# Patient Record
Sex: Female | Born: 1960 | Race: White | Hispanic: No | Smoking: Current every day smoker
Health system: Southern US, Community
[De-identification: ages and names within clinical notes are randomized; demographics above are authoritative.]

## PROBLEM LIST (undated history)

## (undated) DIAGNOSIS — M1990 Unspecified osteoarthritis, unspecified site: Secondary | ICD-10-CM

## (undated) DIAGNOSIS — T148XXA Other injury of unspecified body region, initial encounter: Secondary | ICD-10-CM

## (undated) HISTORY — PX: HAND SURGERY: SHX662

---

## 2003-06-24 ENCOUNTER — Emergency Department (HOSPITAL_COMMUNITY): Admission: EM | Admit: 2003-06-24 | Discharge: 2003-06-25 | Payer: Self-pay | Admitting: Emergency Medicine

## 2003-10-19 ENCOUNTER — Emergency Department (HOSPITAL_COMMUNITY): Admission: EM | Admit: 2003-10-19 | Discharge: 2003-10-19 | Payer: Self-pay | Admitting: Emergency Medicine

## 2008-02-20 ENCOUNTER — Emergency Department (HOSPITAL_COMMUNITY): Admission: EM | Admit: 2008-02-20 | Discharge: 2008-02-20 | Payer: Self-pay | Admitting: Emergency Medicine

## 2008-04-02 ENCOUNTER — Emergency Department (HOSPITAL_COMMUNITY): Admission: EM | Admit: 2008-04-02 | Discharge: 2008-04-02 | Payer: Self-pay | Admitting: Emergency Medicine

## 2008-08-10 ENCOUNTER — Emergency Department (HOSPITAL_COMMUNITY): Admission: EM | Admit: 2008-08-10 | Discharge: 2008-08-10 | Payer: Self-pay | Admitting: Emergency Medicine

## 2008-10-26 ENCOUNTER — Emergency Department (HOSPITAL_COMMUNITY): Admission: EM | Admit: 2008-10-26 | Discharge: 2008-10-26 | Payer: Self-pay | Admitting: Emergency Medicine

## 2008-10-30 DIAGNOSIS — Z8709 Personal history of other diseases of the respiratory system: Secondary | ICD-10-CM | POA: Insufficient documentation

## 2008-10-30 DIAGNOSIS — L03319 Cellulitis of trunk, unspecified: Secondary | ICD-10-CM

## 2008-10-30 DIAGNOSIS — L02219 Cutaneous abscess of trunk, unspecified: Secondary | ICD-10-CM

## 2008-10-30 DIAGNOSIS — F329 Major depressive disorder, single episode, unspecified: Secondary | ICD-10-CM

## 2008-10-30 DIAGNOSIS — F3289 Other specified depressive episodes: Secondary | ICD-10-CM | POA: Insufficient documentation

## 2008-12-16 ENCOUNTER — Emergency Department (HOSPITAL_COMMUNITY): Admission: EM | Admit: 2008-12-16 | Discharge: 2008-12-16 | Payer: Self-pay | Admitting: Emergency Medicine

## 2009-07-29 ENCOUNTER — Ambulatory Visit: Payer: Self-pay | Admitting: Gastroenterology

## 2010-02-08 ENCOUNTER — Emergency Department (HOSPITAL_COMMUNITY): Admission: EM | Admit: 2010-02-08 | Discharge: 2010-02-09 | Payer: Self-pay | Admitting: Emergency Medicine

## 2010-07-06 ENCOUNTER — Telehealth: Payer: Self-pay | Admitting: Infectious Disease

## 2010-07-06 DIAGNOSIS — M86679 Other chronic osteomyelitis, unspecified ankle and foot: Secondary | ICD-10-CM | POA: Insufficient documentation

## 2010-09-20 NOTE — Progress Notes (Addendum)
Summary: Care Plan OVersight  Phone Note Outgoing Call   Call placed by: Acey Lav MD,  July 06, 2010 11:51 AM Details for Reason: Care Plan Oversight Summary of Call: [phone] (30 or more mins)  I have supervised home care and/or infusion therapy for this pt, including providing orders for care, review of labs and/or home health care plans, communicating with the home health care professionals and/or patient/caregivers to integrate current information into the medical treatment plan and/or adjust the medical therapy. This supervision has been provided for _32__minutes during the calendar month. 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

## 2010-11-06 LAB — DIFFERENTIAL
Eosinophils Absolute: 0.1 10*3/uL (ref 0.0–0.7)
Eosinophils Relative: 1 % (ref 0–5)
Lymphs Abs: 2.1 10*3/uL (ref 0.7–4.0)

## 2010-11-06 LAB — PROTIME-INR: Prothrombin Time: 12.4 seconds (ref 11.6–15.2)

## 2010-11-06 LAB — ACETAMINOPHEN LEVEL: Acetaminophen (Tylenol), Serum: 16.2 ug/mL (ref 10–30)

## 2010-11-06 LAB — URINALYSIS, ROUTINE W REFLEX MICROSCOPIC
Ketones, ur: NEGATIVE mg/dL
Nitrite: NEGATIVE
Protein, ur: NEGATIVE mg/dL
Urobilinogen, UA: 0.2 mg/dL (ref 0.0–1.0)

## 2010-11-06 LAB — COMPREHENSIVE METABOLIC PANEL
ALT: 17 U/L (ref 0–35)
AST: 21 U/L (ref 0–37)
Alkaline Phosphatase: 54 U/L (ref 39–117)
CO2: 25 mEq/L (ref 19–32)
Calcium: 10.4 mg/dL (ref 8.4–10.5)
Chloride: 108 mEq/L (ref 96–112)
GFR calc Af Amer: >60 mL/min (ref >60)
GFR calc non Af Amer: >60 mL/min (ref >60)
Potassium: 3.3 mEq/L — ABNORMAL LOW (ref 3.5–5.1)
Sodium: 140 mEq/L (ref 135–145)

## 2010-11-06 LAB — POCT PREGNANCY, URINE: Preg Test, Ur: NEGATIVE

## 2010-11-06 LAB — ETHANOL: Alcohol, Ethyl (B): <5 mg/dL (ref 0–10)

## 2010-11-06 LAB — CBC
MCHC: 34.4 g/dL (ref 30.0–36.0)
RBC: 4.37 MIL/uL (ref 3.87–5.11)
WBC: 9.5 10*3/uL (ref 4.0–10.5)

## 2010-11-06 LAB — RAPID URINE DRUG SCREEN, HOSP PERFORMED: Barbiturates: NOT DETECTED

## 2010-11-06 LAB — AMMONIA: Ammonia: 49 umol/L — ABNORMAL HIGH (ref 11–35)

## 2010-12-01 LAB — CULTURE, ROUTINE-ABSCESS

## 2011-05-19 LAB — CULTURE, ROUTINE-ABSCESS: Gram Stain: NONE SEEN

## 2013-01-30 ENCOUNTER — Encounter (HOSPITAL_COMMUNITY): Payer: Self-pay | Admitting: Adult Health

## 2013-01-30 ENCOUNTER — Ambulatory Visit (HOSPITAL_COMMUNITY)
Admission: EM | Admit: 2013-01-30 | Discharge: 2013-02-01 | Disposition: A | Discharge status: Home / Self Care | Payer: MEDICAID | Attending: Orthopedic Surgery | Admitting: Orthopedic Surgery

## 2013-01-30 DIAGNOSIS — S61409A Unspecified open wound of unspecified hand, initial encounter: Secondary | ICD-10-CM | POA: Insufficient documentation

## 2013-01-30 DIAGNOSIS — W5501XA Bitten by cat, initial encounter: Secondary | ICD-10-CM

## 2013-01-30 DIAGNOSIS — T148XXA Other injury of unspecified body region, initial encounter: Secondary | ICD-10-CM

## 2013-01-30 DIAGNOSIS — IMO0001 Reserved for inherently not codable concepts without codable children: Secondary | ICD-10-CM | POA: Insufficient documentation

## 2013-01-30 DIAGNOSIS — M659 Synovitis and tenosynovitis, unspecified: Secondary | ICD-10-CM

## 2013-01-30 HISTORY — DX: Other injury of unspecified body region, initial encounter: T14.8XXA

## 2013-01-30 LAB — BASIC METABOLIC PANEL
BUN: 9 mg/dL (ref 6–23)
Chloride: 104 mEq/L (ref 96–112)
GFR calc Af Amer: >90 mL/min (ref >90)
Glucose, Bld: 100 mg/dL — ABNORMAL HIGH (ref 70–99)
Potassium: 3.4 mEq/L — ABNORMAL LOW (ref 3.5–5.1)

## 2013-01-30 LAB — CBC WITH DIFFERENTIAL/PLATELET
Hemoglobin: 11.8 g/dL — ABNORMAL LOW (ref 12.0–15.0)
Lymphs Abs: 1.5 10*3/uL (ref 0.7–4.0)
Monocytes Relative: 13 % — ABNORMAL HIGH (ref 3–12)
Neutro Abs: 7.5 10*3/uL (ref 1.7–7.7)
Neutrophils Relative %: 71 % (ref 43–77)
RBC: 3.95 MIL/uL (ref 3.87–5.11)

## 2013-01-30 MED ORDER — OXYCODONE-ACETAMINOPHEN 5-325 MG PO TABS
1.0000 | ORAL_TABLET | Freq: Once | ORAL | Status: AC
  Administered 2013-01-30: 1 via ORAL
  Filled 2013-01-30: qty 1

## 2013-01-30 MED ORDER — TETANUS-DIPHTH-ACELL PERTUSSIS 5-2.5-18.5 LF-MCG/0.5 IM SUSP
0.5000 mL | Freq: Once | INTRAMUSCULAR | Status: AC
  Administered 2013-01-31: 0.5 mL via INTRAMUSCULAR
  Filled 2013-01-30: qty 0.5

## 2013-01-30 MED ORDER — ONDANSETRON 4 MG PO TBDP
8.0000 mg | ORAL_TABLET | Freq: Once | ORAL | Status: AC
  Administered 2013-01-30: 8 mg via ORAL
  Filled 2013-01-30: qty 2

## 2013-01-30 MED ORDER — MORPHINE SULFATE 4 MG/ML IJ SOLN
4.0000 mg | INTRAMUSCULAR | Status: DC | PRN
  Administered 2013-01-31 (×2): 4 mg via INTRAVENOUS
  Filled 2013-01-30 (×2): qty 1

## 2013-01-30 MED ORDER — ONDANSETRON HCL 4 MG/2ML IJ SOLN: 4.0000 mg | Freq: Four times a day (QID) | INTRAMUSCULAR | Status: DC | PRN

## 2013-01-30 MED ORDER — IBUPROFEN 200 MG PO TABS
400.0000 mg | ORAL_TABLET | Freq: Once | ORAL | Status: AC
  Administered 2013-01-30: 400 mg via ORAL
  Filled 2013-01-30: qty 2

## 2013-01-30 MED ORDER — SODIUM CHLORIDE 0.9 % IV SOLN
INTRAVENOUS | Status: DC
  Administered 2013-01-31: 01:00:00 via INTRAVENOUS

## 2013-01-30 MED ORDER — SODIUM CHLORIDE 0.9 % IV SOLN
3.0000 g | Freq: Once | INTRAVENOUS | Status: AC
  Administered 2013-01-31: 3 g via INTRAVENOUS
  Filled 2013-01-30: qty 3

## 2013-01-30 NOTE — ED Notes (Signed)
Presents with left hand edema, redness, warmth and swelling that began after being bitten by her cat Monday. Pain rated 10/10. Cat up to date on rabies.

## 2013-01-30 NOTE — ED Provider Notes (Signed)
History    CSN: [id number] Arrival date & time 01/30/13  2002 First MD Initiated Contact with Patient 01/30/13 2316      Chief Complaint  Patient presents with  . Cellulitis    Patient is a 52 y.o. female presenting with animal bite. The history is provided by the patient.  Animal Bite Contact animal:  Cat Location:  Hand Hand injury location:  R hand Pain details:    Quality:  Aching   Severity:  Severe   Timing:  Constant   Progression:  Worsening Tetanus status:  Unknown Relieved by:  Nothing Worsened by:  Activity Associated symptoms: swelling    Pt was bitten by her cat on Monday.  The cat is up to date on vaccinations.  Since Monday she has had increasing pain and swelling of her left hand.  The swelling has not extended beyond the wrist. .  No fevers although her hand feels very hot.  No vomiting or diarrhea.   History reviewed. No pertinent past medical history.  History reviewed. No pertinent past surgical history.  History reviewed. No pertinent family history.  History  Substance Use Topics  . Smoking status: Current Every Day Smoker    Types: Cigarettes  . Smokeless tobacco: Not on file  . Alcohol Use: No    OB History   Grav Para Term Preterm Abortions TAB SAB Ect Mult Living                  Review of Systems  All other systems reviewed and are negative.    Allergies  Review of patient's allergies indicates no known allergies.  Home Medications   Current Outpatient Rx  Name  Route  Sig  Dispense  Refill  . Acetaminophen (TYLENOL PO)   Oral   Take 4 tablets by mouth once.         . Ibuprofen (IBU PO)   Oral   Take 8 tablets by mouth once.           BP 109/94  Pulse 80  Temp(Src) 98.1 F (36.7 C) (Oral)  Resp 16  SpO2 99%  Physical Exam  Nursing note and vitals reviewed. Constitutional: She appears well-developed and well-nourished. No distress.  HENT:  Head: Normocephalic and atraumatic.  Right Ear: External ear normal.   Left Ear: External ear normal.  Eyes: Conjunctivae are normal. Right eye exhibits no discharge. Left eye exhibits no discharge. No scleral icterus.  Neck: Neck supple. No tracheal deviation present.  Cardiovascular: Normal rate, regular rhythm and intact distal pulses.   Pulmonary/Chest: Effort normal and breath sounds normal. No stridor. No respiratory distress. She has no wheezes. She has no rales.  Abdominal: Soft. Bowel sounds are normal. She exhibits no distension. There is no tenderness. There is no rebound and no guarding.  Musculoskeletal: She exhibits no edema and no tenderness.       Right hand: She exhibits tenderness and swelling. She exhibits no laceration. Normal sensation noted. Normal strength noted.  Small puncture wounds noted at the right first MCP joint, erythema and edema of the right dorsal aspect of the hand primarily located in the MCP joint that extends to the first digit as well as into the second digit and to the dorsum of the hand, pain with passive and active range of motion of the first digit  Neurological: She is alert. She has normal strength. No sensory deficit. Cranial nerve deficit:  no gross defecits noted. She exhibits normal muscle tone. She  displays no seizure activity. Coordination normal.  Skin: Skin is warm and dry. No rash noted.  Psychiatric: She has a normal mood and affect.    ED Course  Procedures (including critical care time)  Labs Reviewed  CBC WITH DIFFERENTIAL - Abnormal; Notable for the following:    Hemoglobin 11.8 (*)    HCT 35.1 (*)    Monocytes Relative 13 (*)    Monocytes Absolute 1.4 (*)    All other components within normal limits  BASIC METABOLIC PANEL - Abnormal; Notable for the following:    Potassium 3.4 (*)    Glucose, Bld 100 (*)    All other components within normal limits  CBC WITH DIFFERENTIAL  BASIC METABOLIC PANEL   No results found.   1. Tenosynovitis   2. Cat bite of hand, right, initial encounter       MDM  Patient's symptoms are concerning for a tenosynovitis as well as possible abscess formation associated with her cat bite cellulitis. I will start patient on IV antibiotics. I will consult with the orthopedic hand surgeon regarding further treatment.  12:07 AM Dr Merlyn Lot evaluated the patient.  Family had brought food in for the patient and the patient ate [employer], without staff knowing.  Operative intervention will be delayed.       Celene Kras, MD 01/31/13 [phone]

## 2013-01-31 ENCOUNTER — Encounter (HOSPITAL_COMMUNITY)
Admission: EM | Disposition: A | Discharge status: Home / Self Care | Payer: Self-pay | Source: Home / Self Care | Attending: Emergency Medicine

## 2013-01-31 ENCOUNTER — Emergency Department (HOSPITAL_COMMUNITY): Payer: Self-pay

## 2013-01-31 ENCOUNTER — Encounter (HOSPITAL_COMMUNITY): Payer: Self-pay | Admitting: Certified Registered"

## 2013-01-31 ENCOUNTER — Emergency Department (HOSPITAL_COMMUNITY): Payer: Self-pay | Admitting: Certified Registered"

## 2013-01-31 HISTORY — PX: I&D EXTREMITY: SHX5045

## 2013-01-31 HISTORY — PX: INCISION / DRAINAGE HAND / FINGER: SUR695

## 2013-01-31 LAB — CREATININE, SERUM
Creatinine, Ser: 0.43 mg/dL — ABNORMAL LOW (ref 0.50–1.10)
GFR calc Af Amer: >90 mL/min (ref >90)
GFR calc non Af Amer: >90 mL/min (ref >90)

## 2013-01-31 LAB — CBC
Hemoglobin: 10.9 g/dL — ABNORMAL LOW (ref 12.0–15.0)
MCH: 29.5 pg (ref 26.0–34.0)
RBC: 3.69 MIL/uL — ABNORMAL LOW (ref 3.87–5.11)

## 2013-01-31 SURGERY — IRRIGATION AND DEBRIDEMENT EXTREMITY
Anesthesia: General | Site: Hand | Laterality: Left | Wound class: Dirty or Infected

## 2013-01-31 MED ORDER — SODIUM CHLORIDE 0.9 % IV SOLN
1.5000 g | Freq: Once | INTRAVENOUS | Status: AC
  Administered 2013-01-31: 1.5 g via INTRAVENOUS
  Filled 2013-01-31: qty 1.5

## 2013-01-31 MED ORDER — LIDOCAINE HCL (CARDIAC) 20 MG/ML IV SOLN
INTRAVENOUS | Status: DC | PRN
  Administered 2013-01-31: 75 mg via INTRAVENOUS

## 2013-01-31 MED ORDER — METHOCARBAMOL 100 MG/ML IJ SOLN
500.0000 mg | Freq: Four times a day (QID) | INTRAVENOUS | Status: DC | PRN
  Filled 2013-01-31: qty 5

## 2013-01-31 MED ORDER — BUPIVACAINE HCL (PF) 0.25 % IJ SOLN
INTRAMUSCULAR | Status: DC | PRN
  Administered 2013-01-31: 10 mL

## 2013-01-31 MED ORDER — MIDAZOLAM HCL 5 MG/5ML IJ SOLN
INTRAMUSCULAR | Status: DC | PRN
  Administered 2013-01-31: 2 mg via INTRAVENOUS

## 2013-01-31 MED ORDER — ONDANSETRON HCL 4 MG/2ML IJ SOLN: 4.0000 mg | Freq: Four times a day (QID) | INTRAMUSCULAR | Status: DC | PRN

## 2013-01-31 MED ORDER — LACTATED RINGERS IV SOLN: INTRAVENOUS | Status: DC

## 2013-01-31 MED ORDER — OXYCODONE HCL 5 MG PO TABS: 5.0000 mg | ORAL_TABLET | Freq: Once | ORAL | Status: DC | PRN

## 2013-01-31 MED ORDER — METOCLOPRAMIDE HCL 5 MG/ML IJ SOLN: 5.0000 mg | Freq: Three times a day (TID) | INTRAMUSCULAR | Status: DC | PRN

## 2013-01-31 MED ORDER — MORPHINE SULFATE 2 MG/ML IJ SOLN
1.0000 mg | INTRAMUSCULAR | Status: DC | PRN
  Administered 2013-01-31 (×2): 1 mg via INTRAVENOUS
  Filled 2013-01-31 (×2): qty 1

## 2013-01-31 MED ORDER — SODIUM CHLORIDE 0.9 % IV SOLN
1.5000 g | Freq: Four times a day (QID) | INTRAVENOUS | Status: DC
  Administered 2013-01-31: 1.5 g via INTRAVENOUS
  Filled 2013-01-31 (×4): qty 1.5

## 2013-01-31 MED ORDER — ONDANSETRON HCL 4 MG PO TABS: 4.0000 mg | ORAL_TABLET | Freq: Four times a day (QID) | ORAL | Status: DC | PRN

## 2013-01-31 MED ORDER — OXYCODONE HCL 5 MG/5ML PO SOLN
5.0000 mg | Freq: Once | ORAL | Status: DC | PRN
Start: 2013-01-31 — End: 2013-01-31

## 2013-01-31 MED ORDER — SUFENTANIL CITRATE 50 MCG/ML IV SOLN
INTRAVENOUS | Status: DC | PRN
  Administered 2013-01-31: 10 ug via INTRAVENOUS

## 2013-01-31 MED ORDER — METOCLOPRAMIDE HCL 10 MG PO TABS: 5.0000 mg | ORAL_TABLET | Freq: Three times a day (TID) | ORAL | Status: DC | PRN

## 2013-01-31 MED ORDER — LIDOCAINE HCL 4 % MT SOLN
OROMUCOSAL | Status: DC | PRN
  Administered 2013-01-31: 4 mL via TOPICAL

## 2013-01-31 MED ORDER — METHOCARBAMOL 500 MG PO TABS
500.0000 mg | ORAL_TABLET | Freq: Four times a day (QID) | ORAL | Status: DC | PRN
  Administered 2013-01-31: 500 mg via ORAL
  Filled 2013-01-31: qty 1

## 2013-01-31 MED ORDER — SUCCINYLCHOLINE CHLORIDE 20 MG/ML IJ SOLN
INTRAMUSCULAR | Status: DC | PRN
  Administered 2013-01-31: 100 mg via INTRAVENOUS

## 2013-01-31 MED ORDER — ONDANSETRON HCL 4 MG/2ML IJ SOLN: 4.0000 mg | Freq: Once | INTRAMUSCULAR | Status: DC | PRN

## 2013-01-31 MED ORDER — DIPHENHYDRAMINE HCL 12.5 MG/5ML PO ELIX: 12.5000 mg | ORAL_SOLUTION | ORAL | Status: DC | PRN

## 2013-01-31 MED ORDER — ENOXAPARIN SODIUM 40 MG/0.4ML ~~LOC~~ SOLN
40.0000 mg | SUBCUTANEOUS | Status: DC
  Filled 2013-01-31 (×2): qty 0.4

## 2013-01-31 MED ORDER — DEXAMETHASONE SODIUM PHOSPHATE 4 MG/ML IJ SOLN
INTRAMUSCULAR | Status: DC | PRN
  Administered 2013-01-31: 4 mg via INTRAVENOUS

## 2013-01-31 MED ORDER — ONDANSETRON HCL 4 MG/2ML IJ SOLN
INTRAMUSCULAR | Status: DC | PRN
  Administered 2013-01-31: 4 mg via INTRAVENOUS

## 2013-01-31 MED ORDER — OXYCODONE-ACETAMINOPHEN 10-325 MG PO TABS: ORAL_TABLET | ORAL | Status: DC

## 2013-01-31 MED ORDER — PROPOFOL 10 MG/ML IV BOLUS
INTRAVENOUS | Status: DC | PRN
  Administered 2013-01-31: 200 mg via INTRAVENOUS

## 2013-01-31 MED ORDER — LACTATED RINGERS IV SOLN
INTRAVENOUS | Status: DC | PRN
  Administered 2013-01-31 (×2): via INTRAVENOUS

## 2013-01-31 MED ORDER — HYDROMORPHONE HCL PF 1 MG/ML IJ SOLN
INTRAMUSCULAR | Status: AC
  Filled 2013-01-31: qty 1

## 2013-01-31 MED ORDER — HYDROMORPHONE HCL PF 1 MG/ML IJ SOLN
0.2500 mg | INTRAMUSCULAR | Status: DC | PRN
  Administered 2013-01-31 (×2): 0.5 mg via INTRAVENOUS

## 2013-01-31 MED ORDER — TEMAZEPAM 15 MG PO CAPS: 15.0000 mg | ORAL_CAPSULE | Freq: Every evening | ORAL | Status: DC | PRN

## 2013-01-31 MED ORDER — SODIUM CHLORIDE 0.9 % IR SOLN
Status: DC | PRN
  Administered 2013-01-31: 1000 mL

## 2013-01-31 MED ORDER — OXYCODONE-ACETAMINOPHEN 5-325 MG PO TABS
1.0000 | ORAL_TABLET | ORAL | Status: DC | PRN
  Administered 2013-01-31 (×2): 1 via ORAL
  Administered 2013-01-31: 2 via ORAL
  Administered 2013-01-31: 1 via ORAL
  Filled 2013-01-31: qty 2
  Filled 2013-01-31 (×3): qty 1

## 2013-01-31 MED ORDER — AMOXICILLIN-POT CLAVULANATE 875-125 MG PO TABS: 1.0000 | ORAL_TABLET | Freq: Two times a day (BID) | ORAL | Status: DC

## 2013-01-31 SURGICAL SUPPLY — 57 items
BANDAGE COBAN STERILE 2 (GAUZE/BANDAGES/DRESSINGS) IMPLANT
BANDAGE CONFORM 2  STR LF (GAUZE/BANDAGES/DRESSINGS) IMPLANT
BANDAGE ELASTIC 3 VELCRO ST LF (GAUZE/BANDAGES/DRESSINGS) ×2 IMPLANT
BANDAGE ELASTIC 4 VELCRO ST LF (GAUZE/BANDAGES/DRESSINGS) IMPLANT
BANDAGE GAUZE ELAST BULKY 4 IN (GAUZE/BANDAGES/DRESSINGS) ×2 IMPLANT
BNDG COHESIVE 1X5 TAN STRL LF (GAUZE/BANDAGES/DRESSINGS) IMPLANT
BNDG ESMARK 4X9 LF (GAUZE/BANDAGES/DRESSINGS) ×2 IMPLANT
CANISTER SUCTION 1500CC (MISCELLANEOUS) ×2 IMPLANT
CLOTH BEACON ORANGE TIMEOUT ST (SAFETY) ×2 IMPLANT
CORDS BIPOLAR (ELECTRODE) ×2 IMPLANT
COVER SURGICAL LIGHT HANDLE (MISCELLANEOUS) ×2 IMPLANT
CUFF TOURNIQUET SINGLE 18IN (TOURNIQUET CUFF) ×2 IMPLANT
DECANTER SPIKE VIAL GLASS SM (MISCELLANEOUS) IMPLANT
DRAIN PENROSE 1/4X12 LTX STRL (WOUND CARE) IMPLANT
DRAPE SURG 17X23 STRL (DRAPES) ×2 IMPLANT
DRSG ADAPTIC 3X8 NADH LF (GAUZE/BANDAGES/DRESSINGS) IMPLANT
DRSG EMULSION OIL 3X3 NADH (GAUZE/BANDAGES/DRESSINGS) IMPLANT
DRSG PAD ABDOMINAL 8X10 ST (GAUZE/BANDAGES/DRESSINGS) IMPLANT
GAUZE PACKING IODOFORM 1/4X5 (PACKING) ×2 IMPLANT
GAUZE XEROFORM 1X8 LF (GAUZE/BANDAGES/DRESSINGS) ×2 IMPLANT
GLOVE BIO SURGEON STRL SZ7.5 (GLOVE) ×4 IMPLANT
GLOVE BIOGEL PI IND STRL 7.5 (GLOVE) ×1 IMPLANT
GLOVE BIOGEL PI IND STRL 8 (GLOVE) ×2 IMPLANT
GLOVE BIOGEL PI INDICATOR 7.5 (GLOVE) ×1
GLOVE BIOGEL PI INDICATOR 8 (GLOVE) ×2
GLOVE SURG SS PI 7.5 STRL IVOR (GLOVE) ×2 IMPLANT
GOWN BRE IMP PREV XXLGXLNG (GOWN DISPOSABLE) ×4 IMPLANT
HANDPIECE INTERPULSE COAX TIP (DISPOSABLE)
KIT BASIN OR (CUSTOM PROCEDURE TRAY) ×2 IMPLANT
KIT ROOM TURNOVER OR (KITS) ×2 IMPLANT
LOOP VESSEL MAXI BLUE (MISCELLANEOUS) ×4 IMPLANT
LOOP VESSEL MINI RED (MISCELLANEOUS) IMPLANT
MANIFOLD NEPTUNE II (INSTRUMENTS) IMPLANT
NEEDLE HYPO 25X1 1.5 SAFETY (NEEDLE) ×2 IMPLANT
NS IRRIG 1000ML POUR BTL (IV SOLUTION) ×2 IMPLANT
PACK ORTHO EXTREMITY (CUSTOM PROCEDURE TRAY) ×2 IMPLANT
PAD ARMBOARD 7.5X6 YLW CONV (MISCELLANEOUS) ×2 IMPLANT
SCRUB BETADINE 4OZ XXX (MISCELLANEOUS) IMPLANT
SET HNDPC FAN SPRY TIP SCT (DISPOSABLE) IMPLANT
SOLUTION BETADINE 4OZ (MISCELLANEOUS) ×2 IMPLANT
SPONGE GAUZE 4X4 12PLY (GAUZE/BANDAGES/DRESSINGS) ×2 IMPLANT
SPONGE LAP 18X18 X RAY DECT (DISPOSABLE) IMPLANT
SPONGE LAP 4X18 X RAY DECT (DISPOSABLE) IMPLANT
SUCTION FRAZIER TIP 10 FR DISP (SUCTIONS) ×2 IMPLANT
SUT ETHILON 4 0 PS 2 18 (SUTURE) IMPLANT
SUT MON AB 5-0 P3 18 (SUTURE) IMPLANT
SWAB COLLECTION DEVICE MRSA (MISCELLANEOUS) ×2 IMPLANT
SYR 20ML ECCENTRIC (SYRINGE) ×2 IMPLANT
SYR CONTROL 10ML LL (SYRINGE) ×2 IMPLANT
TOWEL OR 17X24 6PK STRL BLUE (TOWEL DISPOSABLE) IMPLANT
TOWEL OR 17X26 10 PK STRL BLUE (TOWEL DISPOSABLE) ×2 IMPLANT
TUBE ANAEROBIC SPECIMEN COL (MISCELLANEOUS) ×2 IMPLANT
TUBE CONNECTING 12X1/4 (SUCTIONS) ×2 IMPLANT
TUBE FEEDING 5FR 15 INCH (TUBING) ×2 IMPLANT
UNDERPAD 30X30 INCONTINENT (UNDERPADS AND DIAPERS) ×2 IMPLANT
WATER STERILE IRR 1000ML POUR (IV SOLUTION) IMPLANT
YANKAUER SUCT BULB TIP NO VENT (SUCTIONS) IMPLANT

## 2013-01-31 NOTE — H&P (Signed)
Jessica Vance is an 52 y.o. female.   Chief Complaint: cat bite HPI: 52 yo female states she was bitten by her cat four days ago.  Has had increasing swelling and pain in left index finger since.  No fevers, chills, night sweats.  No previous injury to left hand and no other injury at this time.  History reviewed. No pertinent past medical history.  History reviewed. No pertinent past surgical history.  History reviewed. No pertinent family history. Social History:  reports that she has been smoking Cigarettes.  She has been smoking about 0.00 packs per day. She does not have any smokeless tobacco history on file. She reports that she does not drink alcohol or use illicit drugs.  Allergies: No Known Allergies   (Not in a hospital admission)  Results for orders placed during the hospital encounter of 01/30/13 (from the past 48 hour(s))  CBC WITH DIFFERENTIAL     Status: Abnormal   Collection Time    01/30/13  8:24 PM      Result Value Range   WBC 10.5  4.0 - 10.5 K/uL   RBC 3.95  3.87 - 5.11 MIL/uL   Hemoglobin 11.8 (*) 12.0 - 15.0 g/dL   HCT 45.4 (*) 09.8 - 11.9 %   MCV 88.9  78.0 - 100.0 fL   MCH 29.9  26.0 - 34.0 pg   MCHC 33.6  30.0 - 36.0 g/dL   RDW 14.7  82.9 - 56.2 %   Platelets 274  150 - 400 K/uL   Neutrophils Relative % 71  43 - 77 %   Neutro Abs 7.5  1.7 - 7.7 K/uL   Lymphocytes Relative 15  12 - 46 %   Lymphs Abs 1.5  0.7 - 4.0 K/uL   Monocytes Relative 13 (*) 3 - 12 %   Monocytes Absolute 1.4 (*) 0.1 - 1.0 K/uL   Eosinophils Relative 1  0 - 5 %   Eosinophils Absolute 0.1  0.0 - 0.7 K/uL   Basophils Relative 0  0 - 1 %   Basophils Absolute 0.0  0.0 - 0.1 K/uL  BASIC METABOLIC PANEL     Status: Abnormal   Collection Time    01/30/13  8:24 PM      Result Value Range   Sodium 140  135 - 145 mEq/L   Potassium 3.4 (*) 3.5 - 5.1 mEq/L   Chloride 104  96 - 112 mEq/L   CO2 26  19 - 32 mEq/L   Glucose, Bld 100 (*) 70 - 99 mg/dL   BUN 9  6 - 23 mg/dL   Creatinine,  Ser 1.30  0.50 - 1.10 mg/dL   Calcium 9.7  8.4 - 86.5 mg/dL   GFR calc non Af Amer >90  >90 mL/min   GFR calc Af Amer >90  >90 mL/min   Comment:            The eGFR has been calculated     using the CKD EPI equation.     This calculation has not been     validated in all clinical     situations.     eGFR's persistently     <90 mL/min signify     possible Chronic Kidney Disease.    Dg Hand Complete Left  01/31/2013   *RADIOLOGY REPORT*  Clinical Data: Pain, swelling, and redness to the left hand after a cat bite to the left first MCP joint region.  LEFT HAND - COMPLETE 3+  VIEW  Comparison: None.  Findings: Degenerative changes in the first carpometacarpal joints. No evidence of acute fracture or subluxation.  No focal bone lesion or bone destruction.  Bone cortex and trabecular architecture appear intact.  No radiopaque soft tissue foreign bodies are gas collections.  Dorsal soft tissue swelling is noted.  IMPRESSION: Degenerative changes in the left breast.  No acute bony abnormalities.  Soft tissue swelling.   Original Report Authenticated By: Burman Nieves, M.D.     A comprehensive review of systems was negative.  Blood pressure 97/71, pulse 82, temperature 98.1 F (36.7 C), temperature source Oral, resp. rate 16, SpO2 96.00%.  General appearance: alert, cooperative and appears stated age Head: Normocephalic, without obvious abnormality, atraumatic Neck: supple, symmetrical, trachea midline Resp: clear to auscultation bilaterally Cardio: regular rate and rhythm GI: non tender Extremities: intact sensation and capillary refill all digits.  +epl/fpl/io.  swelling and tenderness at mp joint left index finger.  erythema at mp joint.  two dorsal wounds with drainage and one volar wound.  ttp in finger, minimal swelling of finger. Pulses: 2+ and symmetric Skin: as above Neurologic: Grossly normal Incision/Wound: As above  Assessment/Plan Left hand cat bite with possible index  infected mp joint and possible early tenosynovitis.  Recommend OR for I&D of mp joint and possibly tendon sheath.  Risks, benefits, and alternatives of surgery were discussed and the patient agrees with the plan of care.   Jessica Vance 01/31/2013, 2:51 AM

## 2013-01-31 NOTE — Op Note (Signed)
[id number] 

## 2013-01-31 NOTE — Anesthesia Postprocedure Evaluation (Signed)
  Anesthesia Post-op Note  Patient: Jessica Vance  Procedure(s) Performed: Procedure(s): IRRIGATION AND DEBRIDEMENT EXTREMITY (Left)  Patient Location: PACU  Anesthesia Type:General  Level of Consciousness: awake, alert  and oriented  Airway and Oxygen Therapy: Patient Spontanous Breathing  Post-op Pain: mild  Post-op Assessment: Post-op Vital signs reviewed  Post-op Vital Signs: Reviewed  Complications: No apparent anesthesia complications

## 2013-01-31 NOTE — Progress Notes (Signed)
Subjective: Day of Surgery Procedure(s) (LRB): IRRIGATION AND DEBRIDEMENT EXTREMITY (Left) Patient reports pain as moderate.  Controlled on oral pain meds.  Objective: Vital signs in last 24 hours: Temp:  [97.9 F (36.6 C)-98.8 F (37.1 C)] 98.8 F (37.1 C) (06/13 1350) Pulse Rate:  [70-89] 70 (06/13 1350) Resp:  [12-28] 18 (06/13 1350) BP: (97-143)/(63-94) 112/63 mmHg (06/13 1350) SpO2:  [92 %-99 %] 99 % (06/13 1350) FiO2 (%):  [100 %] 100 % (06/13 0703) Weight:  [45.36 kg (100 lb)] 45.36 kg (100 lb) (06/13 0342)  Intake/Output from previous day: 06/12 0701 - 06/13 0700 In: 1100 [I.V.:1100] Out: -  Intake/Output this shift: Total I/O In: 840 [P.O.:840] Out: -    Recent Labs  01/30/13 2024 01/31/13 0821  HGB 11.8* 10.9*    Recent Labs  01/30/13 2024 01/31/13 0821  WBC 10.5 7.4  RBC 3.95 3.69*  HCT 35.1* 32.8*  PLT 274 235    Recent Labs  01/30/13 2024 01/31/13 0821  NA 140  --   K 3.4*  --   CL 104  --   CO2 26  --   BUN 9  --   CREATININE 0.54 0.43*  GLUCOSE 100*  --   CALCIUM 9.7  --    No results found for this basename: LABPT, INR,  in the last 72 hours  intact sensation and capillary refill all digits.  wiggles fingers.  pain with motion and palpation.  dressing changed.  erythema on dorsum of hand less intense.  no proximal streaks.  no purulent drainage.  Assessment/Plan: Day of Surgery Procedure(s) (LRB): IRRIGATION AND DEBRIDEMENT EXTREMITY (Left) Has been using oral pain medications with adequate pain control.  She would like to go home.  Afebrile with decreased WBC.  I feel it is safe for her to go home.  Oral pain meds and antibiotics.  Follow up in office on Monday for hydrotherapy.  Tanaka Gillen R 01/31/2013, 3:16 PM

## 2013-01-31 NOTE — Brief Op Note (Signed)
01/30/2013 - 01/31/2013  5:34 AM  PATIENT:  Elliot Dally  52 y.o. female  PRE-OPERATIVE DIAGNOSIS:  Cat bite of hand, left, initial encounter [882.0, E906.3]  POST-OPERATIVE DIAGNOSIS:  Cat bite of hand, left, initial encounte  PROCEDURE:  Procedure(s): IRRIGATION AND DEBRIDEMENT EXTREMITY (Left)  SURGEON:  Surgeon(s) and Role:    * Tami Ribas, MD - Primary  PHYSICIAN ASSISTANT:   ASSISTANTS: none   ANESTHESIA:   general  EBL:  Total I/O In: 1000 [I.V.:1000] Out: -   BLOOD ADMINISTERED:none  DRAINS: vessel loop drain x 2 in index mp joint, feeding tube drain in flexor sheath, iodoform packing  LOCAL MEDICATIONS USED:  MARCAINE     SPECIMEN:  Source of Specimen:  left hand  DISPOSITION OF SPECIMEN:  micro  COUNTS:  YES  TOURNIQUET:  51 minutes  DICTATION: .Other Dictation: Dictation Number [id number]  PLAN OF CARE: Outpatient with extended recovery  PATIENT DISPOSITION:  PACU - hemodynamically stable.   Delay start of Pharmacological VTE agent (>24hrs) due to surgical blood loss or risk of bleeding: no

## 2013-01-31 NOTE — Anesthesia Preprocedure Evaluation (Signed)
Anesthesia Evaluation  Patient identified by MRN, date of birth, ID band Patient awake    Reviewed: Allergy & Precautions, H&P , NPO status , Patient's Chart, lab work & pertinent test results, reviewed documented beta blocker date and time   Airway Mallampati: I TM Distance: >3 FB Neck ROM: Full    Dental  (+) Teeth Intact and Dental Advisory Given   Pulmonary  breath sounds clear to auscultation        Cardiovascular Rhythm:Regular Rate:Normal     Neuro/Psych    GI/Hepatic   Endo/Other    Renal/GU      Musculoskeletal   Abdominal   Peds  Hematology   Anesthesia Other Findings   Reproductive/Obstetrics                           Anesthesia Physical Anesthesia Plan  ASA: II and emergent  Anesthesia Plan: General   Post-op Pain Management:    Induction: Intravenous, Rapid sequence and Cricoid pressure planned  Airway Management Planned: Oral ETT  Additional Equipment:   Intra-op Plan:   Post-operative Plan: Extubation in OR  Informed Consent: I have reviewed the patients History and Physical, chart, labs and discussed the procedure including the risks, benefits and alternatives for the proposed anesthesia with the patient or authorized representative who has indicated his/her understanding and acceptance.   Dental advisory given  Plan Discussed with: CRNA, Anesthesiologist and Surgeon  Anesthesia Plan Comments:         Anesthesia Quick Evaluation

## 2013-01-31 NOTE — Op Note (Signed)
Jessica Vance NO.:  [id number]  MEDICAL RECORD NO.:  [id number]  LOCATION:  [id number]                        FACILITY:  [facility]  PHYSICIAN:  Betha Loa, MD        DATE OF BIRTH:  [birth year]  DATE OF PROCEDURE:  01/31/2013 DATE OF DISCHARGE:                              OPERATIVE REPORT   PREOPERATIVE DIAGNOSIS:  Left hand and index finger metacarpophalangeal joint infection, possible flexor tenosynovitis.  POSTOPERATIVE DIAGNOSIS:  Left hand and index finger metacarpophalangeal joint infection, possible flexor tenosynovitis.  PROCEDURE:  Left hand irrigation and debridement including index finger, MP joint and index finger flexor tendon sheath.  SURGEON:  Betha Loa, MD  ASSISTANT:  None.  ANESTHESIA:  General.  IV FLUIDS:  Per anesthesia flow sheet.  ESTIMATED BLOOD LOSS:  Minimal.  COMPLICATIONS:  None.  SPECIMENS:  Cultures to Micro.  TOURNIQUET TIME:  51 minutes.  DISPOSITION:  Stable to PACU.  INDICATIONS:  Ms. Jessica Vance is a 52 year old female who was bitten by her cat 4 days ago.  She has had increasing pain and swelling in the hand since.  She presented to the [facility] yesterday and I was consulted for management of the condition.  She had a swollen and erythematous hand at the MP joint of the index finger.  She had pain volarly as well.  I recommended going to the operating room for irrigation and debridement of the hand including index finger MP joint and likely the flexor tendon sheath.  Risks, benefits and alternatives of surgery were discussed including the risk of blood loss; infection; damage to nerves, vessels, tendons, ligaments, bone; failure of surgery; need for additional surgery; complications with wound healing; continued pain; continued infection; need for repeat irrigation and debridement. She voiced understanding of these risks and elected to proceed.  OPERATIVE COURSE:  After being identified  preoperatively by myself, the patient and I agreed upon the procedure and site of procedure.  Surgical site was marked.  Risks, benefits, and alternatives of surgery were reviewed and she wished to proceed.  Surgical consent had been signed. She was transferred to the operating room and placed on the operating room table in supine position with left upper extremity on an armboard. General anesthesia was induced by the anesthesiologist.  The left upper extremity was prepped and draped in normal sterile orthopedic fashion. Surgical pause was performed between the surgeons, anesthesia, and operating room staff, and all were in agreement as to the patient, procedure, and site of procedure.  Tourniquet at the proximal aspect of the extremity was inflated to 250 mmHg after exsanguination of the hand by gravity in the forearm by Esmarch bandage.  Incision was made on the dorsum of the hand over the MP joint of the index finger including the bite wounds.  This was carried into subcutaneous tissues by spreading technique.  An incision was made in the index finger extensor tendon longitudinally.  The capsule was incised.  There was gross purulence from within the capsule and underneath the tendon.  This was cultured. A small portion of the capsule was excised to allow continued drainage.  There was significant amount of  purulence between the metacarpals of the index and long finger.  This tracked volarly.  The purulence also coursed underneath the skin over the long finger metacarpal.  Attention was turned to the volar surface of the hand. Incision was made over the MP joint of the index finger and carried into subcutaneous tissues by spreading technique.  There was no gross purulence.  The A1 pulley was incised.  There was some fluid within the tendon sheath, but no gross purulence.  An incision was made at the distal phalanx.  A #5 Pediatric feeding tube was advanced into the flexor tendon sheath  and the sheath irrigated with 100 mL of sterile saline by the pediatric feeding tube.  Good effluent was obtained from both the proximal and distal wounds.  The dorsal wound was debrided using the rongeurs to debride the subcutaneous tissues of any necrotic fat and remaining purulence.  The MP joint and tissues were all copiously irrigated with a 1000 mL of sterile saline.  The pediatric feeding tube was left in the flexor sheath.  The MP joint was held open with 2-vessel loop drains and a piece of iodoform gauze.  All wounds were packed open with 0.25-inch iodoform gauze.  They were injected with 10 mL of 0.25% plain Marcaine to aid in postoperative analgesia.  The wounds were then dressed with sterile Xeroform, 4x4s, and wrapped with a Kerlix bandage.  A volar splint was placed and wrapped with Kerlix and Ace bandage.  Tourniquet was deflated at 51 minutes.  Fingertips were pink with brisk capillary refill after deflation of the tourniquet. Operative drapes were broken down.  The patient was awoken from anesthesia safely.  She was transferred back to the stretcher and taken to PACU in stable condition.  I will keep her in the hospital for extended recovery for IV antibiotics.  We will check on her later today for potential discharge.     Betha Loa, MD     KK/MEDQ  D:  01/31/2013  T:  01/31/2013  Job:  [id number]

## 2013-01-31 NOTE — Transfer of Care (Signed)
Immediate Anesthesia Transfer of Care Note  Patient: Jessica Vance  Procedure(s) Performed: Procedure(s): IRRIGATION AND DEBRIDEMENT EXTREMITY (Left)  Patient Location: PACU  Anesthesia Type:General  Level of Consciousness: awake, alert , oriented and patient cooperative  Airway & Oxygen Therapy: Patient Spontanous Breathing and Patient connected to nasal cannula oxygen  Post-op Assessment: Report given to PACU RN, Post -op Vital signs reviewed and stable and Patient moving all extremities X 4  Post vital signs: Reviewed and stable  Complications: No apparent anesthesia complications

## 2013-01-31 NOTE — Progress Notes (Signed)
ANTIBIOTIC CONSULT NOTE - INITIAL  Pharmacy Consult for Unasyn Indication: s/p I&D of cat bite of L hand  No Known Allergies  Patient Measurements: Height: 5\' 4"  (162.6 cm) Weight: 100 lb (45.36 kg) IBW/kg (Calculated) : 54.7  Vital Signs: Temp: 98.3 F (36.8 C) (06/13 0627) Temp src: Oral (06/12 2012) BP: 141/82 mmHg (06/13 0627) Pulse Rate: 80 (06/13 0627) Intake/Output from previous day: 06/12 0701 - 06/13 0700 In: 1100 [I.V.:1100] Out: -  Intake/Output from this shift:    Labs:  Recent Labs  01/30/13 2024  WBC 10.5  HGB 11.8*  PLT 274  CREATININE 0.54   Estimated Creatinine Clearance: 60.3 ml/min (by C-G formula based on Cr of 0.54). No results found for this basename: VANCOTROUGH, VANCOPEAK, VANCORANDOM, GENTTROUGH, GENTPEAK, GENTRANDOM, TOBRATROUGH, TOBRAPEAK, TOBRARND, AMIKACINPEAK, AMIKACINTROU, AMIKACIN,  in the last 72 hours   Microbiology: No results found for this or any previous visit (from the past 720 hour(s)).  Medical History: History reviewed. No pertinent past medical history.  Medications:  Prescriptions prior to admission  Medication Sig Dispense Refill  . Acetaminophen (TYLENOL PO) Take 4 tablets by mouth once.      . Ibuprofen (IBU PO) Take 8 tablets by mouth once.       Assessment: 52 y.o. female presents after being bit by her cat 4 days ago. She has noticed increased swelling and pain. S/p I&D 6/13. Received unasyn 3gm IV ~2400 in ED and 1.5gm in OR ~0430.  Goal of Therapy:  Eradication of infection  Plan:  1. Unasyn 1.5gm IV q6h. 2. Will f/u microbiological data, renal function  Christoper Fabian, PharmD, BCPS Clinical pharmacist, pager [phone] 01/31/2013,7:07 AM

## 2013-02-01 NOTE — Progress Notes (Signed)
Pt d/c'ed to home, taken to car via w/c by staff, accompanied by pt's son.

## 2013-02-04 ENCOUNTER — Encounter (HOSPITAL_COMMUNITY): Payer: Self-pay | Admitting: Orthopedic Surgery

## 2013-02-05 LAB — ANAEROBIC CULTURE

## 2013-02-05 LAB — WOUND CULTURE

## 2015-12-31 ENCOUNTER — Other Ambulatory Visit: Payer: Self-pay | Admitting: Family

## 2015-12-31 DIAGNOSIS — Z1231 Encounter for screening mammogram for malignant neoplasm of breast: Secondary | ICD-10-CM

## 2016-02-18 ENCOUNTER — Ambulatory Visit
Admission: RE | Admit: 2016-02-18 | Discharge: 2016-02-18 | Disposition: A | Discharge status: Home / Self Care | Payer: BLUE CROSS/BLUE SHIELD | Source: Ambulatory Visit | Attending: Family | Admitting: Family

## 2016-02-18 DIAGNOSIS — Z1231 Encounter for screening mammogram for malignant neoplasm of breast: Secondary | ICD-10-CM

## 2016-02-29 ENCOUNTER — Other Ambulatory Visit: Payer: Self-pay | Admitting: Family

## 2016-02-29 ENCOUNTER — Ambulatory Visit
Admission: RE | Admit: 2016-02-29 | Discharge: 2016-02-29 | Disposition: A | Discharge status: Home / Self Care | Payer: BLUE CROSS/BLUE SHIELD | Source: Ambulatory Visit | Attending: Family | Admitting: Family

## 2016-02-29 DIAGNOSIS — M79641 Pain in right hand: Secondary | ICD-10-CM

## 2018-04-27 ENCOUNTER — Emergency Department (HOSPITAL_COMMUNITY): Payer: BLUE CROSS/BLUE SHIELD

## 2018-04-27 ENCOUNTER — Emergency Department (HOSPITAL_COMMUNITY)
Admission: EM | Admit: 2018-04-27 | Discharge: 2018-04-27 | Disposition: A | Discharge status: Home / Self Care | Payer: BLUE CROSS/BLUE SHIELD | Attending: Emergency Medicine | Admitting: Emergency Medicine

## 2018-04-27 ENCOUNTER — Other Ambulatory Visit: Payer: Self-pay

## 2018-04-27 ENCOUNTER — Encounter (HOSPITAL_COMMUNITY): Payer: Self-pay

## 2018-04-27 DIAGNOSIS — S0990XA Unspecified injury of head, initial encounter: Secondary | ICD-10-CM | POA: Diagnosis present

## 2018-04-27 DIAGNOSIS — Y939 Activity, unspecified: Secondary | ICD-10-CM | POA: Diagnosis not present

## 2018-04-27 DIAGNOSIS — F17210 Nicotine dependence, cigarettes, uncomplicated: Secondary | ICD-10-CM | POA: Diagnosis not present

## 2018-04-27 DIAGNOSIS — I6200 Nontraumatic subdural hemorrhage, unspecified: Secondary | ICD-10-CM | POA: Diagnosis not present

## 2018-04-27 DIAGNOSIS — R079 Chest pain, unspecified: Secondary | ICD-10-CM | POA: Insufficient documentation

## 2018-04-27 DIAGNOSIS — Y999 Unspecified external cause status: Secondary | ICD-10-CM | POA: Diagnosis not present

## 2018-04-27 DIAGNOSIS — Z79899 Other long term (current) drug therapy: Secondary | ICD-10-CM | POA: Insufficient documentation

## 2018-04-27 DIAGNOSIS — Y929 Unspecified place or not applicable: Secondary | ICD-10-CM | POA: Insufficient documentation

## 2018-04-27 HISTORY — DX: Unspecified osteoarthritis, unspecified site: M19.90

## 2018-04-27 LAB — RAPID URINE DRUG SCREEN, HOSP PERFORMED
Amphetamines: NOT DETECTED
BENZODIAZEPINES: POSITIVE — AB
Barbiturates: NOT DETECTED
COCAINE: NOT DETECTED
OPIATES: NOT DETECTED
Tetrahydrocannabinol: NOT DETECTED

## 2018-04-27 MED ORDER — DIPHENHYDRAMINE HCL 25 MG PO CAPS
25.0000 mg | ORAL_CAPSULE | Freq: Once | ORAL | Status: AC
  Administered 2018-04-27: 25 mg via ORAL
  Filled 2018-04-27: qty 1

## 2018-04-27 MED ORDER — IBUPROFEN 800 MG PO TABS
800.0000 mg | ORAL_TABLET | Freq: Once | ORAL | Status: AC
  Administered 2018-04-27: 800 mg via ORAL
  Filled 2018-04-27: qty 1

## 2018-04-27 MED ORDER — PROCHLORPERAZINE MALEATE 10 MG PO TABS
10.0000 mg | ORAL_TABLET | Freq: Once | ORAL | Status: AC
  Administered 2018-04-27: 10 mg via ORAL
  Filled 2018-04-27: qty 1

## 2018-04-27 NOTE — ED Notes (Signed)
Patient is A & O x4.  She understood AVS instructions.  Son is at bedside
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
## 2018-04-27 NOTE — Discharge Instructions (Addendum)
I have provided a referral to Dr. Jordan Likes neurosurgery, please follow-up as needed or if anything changes. PLEASE AVOID ASPIRIN PRODUCTS such Bayer, Advil, Motrin, Naproxen, Excedrin. Please follow-up with PCP in 1 week for reevaluation of your symptoms.You may take tylenol for your headache only.
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
## 2018-04-27 NOTE — ED Notes (Signed)
1st urine request made,patient unable to provide one at this time.
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
## 2019-01-14 ENCOUNTER — Ambulatory Visit (INDEPENDENT_AMBULATORY_CARE_PROVIDER_SITE_OTHER): Payer: BLUE CROSS/BLUE SHIELD | Admitting: Orthopaedic Surgery

## 2019-01-14 ENCOUNTER — Other Ambulatory Visit: Payer: Self-pay

## 2019-01-14 ENCOUNTER — Ambulatory Visit: Payer: Self-pay

## 2019-01-14 ENCOUNTER — Encounter: Payer: Self-pay | Admitting: Orthopaedic Surgery

## 2019-01-14 VITALS — Ht 64.0 in | Wt 110.0 lb

## 2019-01-14 DIAGNOSIS — M5136 Other intervertebral disc degeneration, lumbar region: Secondary | ICD-10-CM | POA: Diagnosis not present

## 2019-01-14 DIAGNOSIS — M51369 Other intervertebral disc degeneration, lumbar region without mention of lumbar back pain or lower extremity pain: Secondary | ICD-10-CM

## 2019-01-14 DIAGNOSIS — M5416 Radiculopathy, lumbar region: Secondary | ICD-10-CM

## 2019-01-14 DIAGNOSIS — M545 Low back pain, unspecified: Secondary | ICD-10-CM

## 2019-01-14 DIAGNOSIS — M4807 Spinal stenosis, lumbosacral region: Secondary | ICD-10-CM | POA: Diagnosis not present

## 2019-01-14 DIAGNOSIS — M4316 Spondylolisthesis, lumbar region: Secondary | ICD-10-CM

## 2019-01-14 DIAGNOSIS — G8929 Other chronic pain: Secondary | ICD-10-CM

## 2019-01-14 NOTE — Progress Notes (Signed)
Office Visit Note   Patient: Jessica Vance           Date of Birth: 1961/07/08           MRN: [id number] Visit Date: 01/14/2019              Requested by: No referring provider defined for this encounter. PCP: Patient, No Pcp Per   Assessment & Plan: Visit Diagnoses:  1. Chronic right-sided low back pain, unspecified whether sciatica present   2. Radiculopathy, lumbar region     Plan: With patient's ongoing chronic pain, failed conservative treatment along with the severity of findings on x-ray recommend getting lumbar MRI.  Patient is unable to take oral NSAIDs due to likely history of peptic ulcer disease.  Follow-up in 4 weeks for recheck and I will ask Dr. Otelia Sergeant to review patient's MRI.  X-rays today were reviewed with Dr. Ophelia Charter and he agrees to Dr. Otelia Sergeant looking at the scan.    Follow-Up Instructions: Return in about 4 weeks (around 02/11/2019).   Orders:  Orders Placed This Encounter  Procedures  . XR Lumbar Spine 2-3 Views   No orders of the defined types were placed in this encounter.     Procedures: No procedures performed   Clinical Data: No additional findings.   Subjective: Chief Complaint  Patient presents with  . Lower Back - Pain    HPI 58 year old white female comes in today with complaints of chronic and worsening low back pain.  Back pain off and on for at least 15 years but worse over the last year or so.  Pain radiates from the low back to the buttock and down the right leg to her shin/ankle and described as being constant at times.  States that she cannot stand for long periods.  Walking distances have greatly decreased due to increase in her symptoms.  She continues to work but low back problems are having a significant negative impact on her quality of life.  No complaints of bowel or bladder incontinence.  Denies numbness and tingling.  States that she has never seen anybody for her back for treatment in the past.  Right leg radicular symptoms  ongoing since the summer 2019.  No left leg symptoms. Review of Systems Patient does complain of abdominal pain at times when she takes ibuprofen.  Denies melena, hematochezia.  No current cardiac pulmonary GU issues  Objective: Vital Signs: Ht 5\' 4"  (1.626 m)   Wt 110 lb (49.9 kg)   BMI 18.88 kg/m   Physical Exam HENT:     Head: Normocephalic.  Eyes:     Extraocular Movements: Extraocular movements intact.     Pupils: Pupils are equal, round, and reactive to light.  Pulmonary:     Effort: No respiratory distress.  Musculoskeletal:     Comments: Gait is antalgic.  Lumbar paraspinal tenderness.  Right greater trochanter bursa tenderness.  Negative logroll bilateral hips.  Negative straight leg raise.  Neurovas intact.  No focal motor deficits.  Neurological:     General: No focal deficit present.     Mental Status: She is alert and oriented to person, place, and time.  Psychiatric:        Mood and Affect: Mood normal.     Ortho Exam  Specialty Comments:  No specialty comments available.  Imaging: No results found.   PMFS History: Patient Active Problem List   Diagnosis Date Noted  . CHRONIC OSTEOMYELITIS ANKLE AND FOOT 07/06/2010  . DEPRESSION  10/30/2008  . ABSCESS, BACK 10/30/2008  . PNEUMONIA, HX OF 10/30/2008   Past Medical History:  Diagnosis Date  . Animal bite 01/30/2013   LEFT HAND  . Arthritis     Family History  Family history unknown: Yes    Past Surgical History:  Procedure Laterality Date  . HAND SURGERY    . I&D EXTREMITY Left 01/31/2013   Procedure: IRRIGATION AND DEBRIDEMENT EXTREMITY;  Surgeon: Tami RibasKevin R Kuzma, MD;  [facility];  Service: Orthopedics;  Laterality: Left;  . INCISION / DRAINAGE HAND / FINGER Left 01/31/2013   Social History   Occupational History  . Not on file  Tobacco Use  . Smoking status: Current Every Day Smoker    Packs/day: 1.00    Years: 30.00    Pack years: 30.00    Types: Cigarettes  . Smokeless tobacco:  Never Used  Substance and Sexual Activity  . Alcohol use: No  . Drug use: Not Currently    Comment: Methadone maintenance program  . Sexual activity: Not on file

## 2019-01-18 ENCOUNTER — Telehealth: Payer: Self-pay | Admitting: Cardiology

## 2019-01-18 NOTE — Telephone Encounter (Signed)
Patient unavailable.  Spoke with son Yina Geddes, explained that patient was possible exposed to Covid during recent visit 5/26.  Explained that we are offering free testing supplied number [phone] if she is interested in being tested.

## 2019-01-21 ENCOUNTER — Telehealth: Payer: Self-pay

## 2019-01-21 NOTE — Telephone Encounter (Signed)
I left voicemail advising report has not come up in Care Everywhere in chart yet. As soon as report is available, I will pass on to Dr. Ophelia Charter.

## 2019-01-21 NOTE — Telephone Encounter (Signed)
At this time, no report in chart to review. Patient had MRI done with [facility].

## 2019-01-21 NOTE — Telephone Encounter (Signed)
Patients husband called to let Dr Ophelia Charter know that patient had her MRI done on Saturday [phone]

## 2019-01-23 NOTE — Telephone Encounter (Signed)
Report still not in Care Everywhere. Patient has appointment to review MRI tomorrow afternoon.

## 2019-01-24 ENCOUNTER — Encounter: Payer: Self-pay | Admitting: Orthopaedic Surgery

## 2019-01-24 ENCOUNTER — Other Ambulatory Visit: Payer: Self-pay

## 2019-01-24 ENCOUNTER — Ambulatory Visit (INDEPENDENT_AMBULATORY_CARE_PROVIDER_SITE_OTHER): Payer: BLUE CROSS/BLUE SHIELD | Admitting: Orthopaedic Surgery

## 2019-01-24 VITALS — Ht 64.0 in | Wt 110.0 lb

## 2019-01-24 DIAGNOSIS — M5136 Other intervertebral disc degeneration, lumbar region: Secondary | ICD-10-CM

## 2019-01-24 DIAGNOSIS — M47816 Spondylosis without myelopathy or radiculopathy, lumbar region: Secondary | ICD-10-CM | POA: Diagnosis not present

## 2019-01-24 DIAGNOSIS — M419 Scoliosis, unspecified: Secondary | ICD-10-CM

## 2019-01-24 NOTE — Telephone Encounter (Signed)
I called [facility] and had report faxed. Patient was reminded at time of time of appt to bring CD of images with her to appt.

## 2019-01-24 NOTE — Progress Notes (Signed)
Office Visit Note   Patient: Jessica DallyRobin Vance           Date of Birth: 04/18/1961           MRN: [id number] Visit Date: 01/24/2019              Requested by: No referring provider defined for this encounter. PCP: Patient, No Pcp Per   Assessment & Plan: Visit Diagnoses:  1. Scoliosis of lumbar spine, unspecified scoliosis type   2. Other intervertebral disc degeneration, lumbar region   3. Lumbar facet arthropathy     Plan: Patient has lumbar scoliosis with a right lumbar curve with lateral listhesis disc degeneration severe facet arthropathy and multilevel changes.  I discussed with her that correction for surgically would be a multilevel procedure.  Current significant amount of pain medication she is taking at methadone 180 mg daily would present a problem controlling her pain after the procedure.  We will have Jessica Vance to take a look at her films and MRI findings and see if this is something he could possibly consider operative treatment or if he like to refer her to a tertiary care center for possible surgery.  Follow-Up Instructions: No follow-ups on file.   Orders:  No orders of the defined types were placed in this encounter.  No orders of the defined types were placed in this encounter.     Procedures: No procedures performed   Clinical Data: No additional findings.   Subjective: Chief Complaint  Patient presents with  . Lower Back - Follow-up    Lumbar MRI Review    HPI 58 year old female who is on chronic methadone 108 mg daily is here with persistent problems with back pain with varying degrees of narrowing and significant lumbar curvature degeneration with some lateral listhesis.  Patient had an MRI scan ordered by Jessica KiefJames Owens, PA-C and [facility].  Patient states everything seems to bother her.  She is having trouble sleeping ibuprofen bothers her stomach she has been on chronic narcotic pain medication for  several years.  Review of Systems positive for chronic pain medication narcotic usage.  History of chronic osteomyelitis ankle and foot.  Depression.  History of back abscess pneumonia.  Lumbar scoliosis, right.   Objective: Vital Signs: Ht 5\' 4"  (1.626 m)   Wt 110 lb (49.9 kg)   BMI 18.88 kg/m   Physical Exam Constitutional:      Appearance: She is well-developed.  HENT:     Head: Normocephalic.     Right Ear: External ear normal.     Left Ear: External ear normal.  Eyes:     Pupils: Pupils are equal, round, and reactive to light.  Neck:     Thyroid: No thyromegaly.     Trachea: No tracheal deviation.  Cardiovascular:     Rate and Rhythm: Normal rate.  Pulmonary:     Effort: Pulmonary effort is normal.  Abdominal:     Palpations: Abdomen is soft.  Skin:    General: Skin is warm and dry.  Neurological:     Mental Status: She is alert and oriented to person, place, and time.  Psychiatric:        Behavior: Behavior normal.   Depressed with flat affect.  She leans forward on her knees to get some relief.  Ortho Exam patient has negative logroll of the hips paralumbar tenderness.  Some trochanteric bursal tenderness right and left.  Knees reach full  extension.  Distal pulses are intact anterior tib gastrocsoleus is strong.  Specialty Comments:  No specialty comments available.  Imaging: Lumbar MRI scan from 01/20/2019 reviewed.  Conclusion was multilevel degenerative and scoliotic changes resulting in varying degrees of canal and foraminal stenosis most pronounced spanning L3-S1 including advanced central and lateral recess stenosis at L3-L4 and L4-L5 as well as moderate lateral recess narrowing bilaterally at L5-S1 resulting in crowding of the bilateral descending S1 nerve roots.   PMFS History: Patient Active Problem List   Diagnosis Date Noted  . Scoliosis of lumbar spine 01/26/2019  . Lumbar facet arthropathy 01/26/2019  . Other intervertebral disc degeneration,  lumbar region 01/26/2019  . CHRONIC OSTEOMYELITIS ANKLE AND FOOT 07/06/2010  . DEPRESSION 10/30/2008  . ABSCESS, BACK 10/30/2008  . PNEUMONIA, HX OF 10/30/2008   Past Medical History:  Diagnosis Date  . Animal bite 01/30/2013   LEFT HAND  . Arthritis     Family History  Family history unknown: Yes    Past Surgical History:  Procedure Laterality Date  . HAND SURGERY    . I&D EXTREMITY Left 01/31/2013   Procedure: IRRIGATION AND DEBRIDEMENT EXTREMITY;  Surgeon: Tami Ribas, MD;  Location: [facility] OR;  Service: Orthopedics;  Laterality: Left;  . INCISION / DRAINAGE HAND / FINGER Left 01/31/2013   Social History   Occupational History  . Not on file  Tobacco Use  . Smoking status: Current Every Day Smoker    Packs/day: 1.00    Years: 30.00    Pack years: 30.00    Types: Cigarettes  . Smokeless tobacco: Never Used  Substance and Sexual Activity  . Alcohol use: No  . Drug use: Not Currently    Comment: Methadone maintenance program  . Sexual activity: Not on file

## 2019-01-26 ENCOUNTER — Encounter: Payer: Self-pay | Admitting: Orthopaedic Surgery

## 2019-01-26 DIAGNOSIS — M419 Scoliosis, unspecified: Secondary | ICD-10-CM | POA: Insufficient documentation

## 2019-01-26 DIAGNOSIS — M47816 Spondylosis without myelopathy or radiculopathy, lumbar region: Secondary | ICD-10-CM | POA: Insufficient documentation

## 2019-01-26 DIAGNOSIS — M5136 Other intervertebral disc degeneration, lumbar region: Secondary | ICD-10-CM | POA: Insufficient documentation

## 2019-01-27 ENCOUNTER — Other Ambulatory Visit: Payer: Self-pay

## 2019-01-27 ENCOUNTER — Encounter: Payer: Self-pay | Admitting: Specialist

## 2019-01-27 ENCOUNTER — Ambulatory Visit: Payer: Self-pay

## 2019-01-27 ENCOUNTER — Ambulatory Visit (INDEPENDENT_AMBULATORY_CARE_PROVIDER_SITE_OTHER): Payer: BLUE CROSS/BLUE SHIELD | Admitting: Specialist

## 2019-01-27 VITALS — BP 127/81 | HR 85 | Ht 64.0 in | Wt 110.0 lb

## 2019-01-27 DIAGNOSIS — M542 Cervicalgia: Secondary | ICD-10-CM | POA: Diagnosis not present

## 2019-01-27 DIAGNOSIS — M419 Scoliosis, unspecified: Secondary | ICD-10-CM

## 2019-01-27 DIAGNOSIS — M4807 Spinal stenosis, lumbosacral region: Secondary | ICD-10-CM | POA: Diagnosis not present

## 2019-01-27 DIAGNOSIS — M4316 Spondylolisthesis, lumbar region: Secondary | ICD-10-CM | POA: Diagnosis not present

## 2019-01-27 MED ORDER — GABAPENTIN 300 MG PO CAPS: ORAL_CAPSULE | ORAL | 0 refills | Status: DC

## 2019-01-27 MED ORDER — DICLOFENAC-MISOPROSTOL 50-0.2 MG PO TBEC: DELAYED_RELEASE_TABLET | ORAL | 2 refills | Status: DC

## 2019-01-27 MED ORDER — DICLOFENAC-MISOPROSTOL 50-0.2 MG PO TBEC: DELAYED_RELEASE_TABLET | ORAL | 2 refills | Status: AC

## 2019-01-27 NOTE — Patient Instructions (Signed)
Avoid bending, stooping and avoid lifting weights greater than 10 lbs. Avoid prolong standing and walking. Avoid frequent bending and stooping  No lifting greater than 10 lbs. May use ice or moist heat for pain. Weight loss is of benefit. Handicap license is approved.  Diclofenac 50 mg  BID of TID with meal or snack Misoprolol 200mg  to 2-3 times per day with the diclofenac.  Gabapentin 300mg  po qhs for 3 days then BID for 3 day the TID.

## 2019-01-27 NOTE — Addendum Note (Signed)
Addended by: Meyer Cory on: 01/27/2019 04:22 PM   Modules accepted: Orders

## 2019-01-27 NOTE — Progress Notes (Addendum)
Office Visit Note   Patient: Jessica Vance           Date of Birth: 02-Nov-1960           MRN: [id number] Visit Date: 01/27/2019              Requested by: No referring provider defined for this encounter. PCP: Patient, No Pcp Per   Assessment & Plan: Visit Diagnoses:  1. Scoliosis of thoracolumbar spine, unspecified scoliosis type   2. Spinal stenosis of lumbosacral region   3. Cervicalgia    58 year old female with history of chronic low back pain recently with radiation into the right lower extremity. She is stooped and pain worsens with standing and walking and improves with stooping and sitting and getting off her feet. She has a right sided lumbar scoliosis and a left thoracic scoliosis. The right L3 is subluxated lateral on L4 by 8-9 mm. She has multilevel Lumbar DDD with MRI showing foramenal narrowing right L3-4 and L4-5 and likely subarticular lateral recess stenosis L4-5 right side. Will start a longer acting NSAIDs diclofenac as she is taking to much advil. Start a nerve membrane stabilizing medication gabapentin. Advised on  Sitting bike or pool walking exercises. Will obtain long cassetted scoliosis radiographs to assess her overall spinal alignment from the cervical spine to the sacrum and check pelvic  Incidence. She is beginning to show signs of decompensation of her spine with left list and  Forward tilt of the thoracolumbar spine suggesting she is developing kyphosis and loss of sagital alignment. As she is developing sciatica she likely is seeing worseing nerve compression and may need to consider a long segment thoracolumbar fusion for decompression and stabilization of the thoracolumbar scoliosis. She also has hyperreflexia with pathologic spread of the patella reflexes suggesting possible thoracic or cervical area of  Cord compression. I will order complete myelogram due the abnormal LE reflexes and  To assess for possible neurogenic (spinal cord) cause of left thoracic  curve and worsing of  Her symptoms. Bone density should be done to assess for osteoporosis or osteopenia as this Would impact any decision making concerning surgery and may be a concern as to progression of scolios in an adult. It may be one of the few conditions that may be treatable  Related to progression of scoliosis.   Plan: Avoid bending, stooping and avoid lifting weights greater than 10 lbs. Avoid prolong standing and walking. Avoid frequent bending and stooping  No lifting greater than 10 lbs. May use ice or moist heat for pain. Weight loss is of benefit. Handicap license is approved.  Diclofenac 50 mg  BID of TID with meal or snack Misoprolol 200mg  to 2-3 times per day with the diclofenac.  Gabapentin 300mg  po qhs for 3 days then BID for 3 day the TID. Bone density testing. Follow-Up Instructions: No follow-ups on file.   Orders:  No orders of the defined types were placed in this encounter.  No orders of the defined types were placed in this encounter.     Procedures: No procedures performed   Clinical Data: No additional findings.   Subjective: Chief Complaint  Patient presents with  . Lower Back - Pain  . MRI Review    58 year old female with 16 year history of mid and lower right lumbar pain with radiation into the right buttock, right lateral and anterior thigh. Pain with standing and walking. She is leaning on carts and on the tables with  walking and she is doing this at work with standing all day in [employer] and she reports pain is better with sitting and getting off her feet. A hand full of ibuprofen and tries to find some thing to distract her. She tends to avoid MDs and has not been to PT. The leg on the right was not so bad till lately. Pain with standing in line and getting out of the car with  Twisting, with shooting pain into the right leg. Limping with walking and is using a handicap parking Area.    Review of Systems  Constitutional:  Positive for activity change. Negative for appetite change, chills, diaphoresis, fatigue, fever and unexpected weight change.  HENT: Negative for congestion, dental problem, drooling, ear discharge, ear pain, facial swelling, mouth sores, nosebleeds, postnasal drip, rhinorrhea, sinus pressure, sinus pain, sneezing, sore throat, tinnitus and trouble swallowing.   Eyes: Positive for visual disturbance. Negative for photophobia, pain, discharge, redness and itching.  Respiratory: Negative for apnea, cough, choking, chest tightness, shortness of breath, wheezing and stridor.   Cardiovascular: Negative for chest pain, palpitations and leg swelling.  Gastrointestinal: Positive for abdominal pain. Negative for abdominal distention, anal bleeding, blood in stool, constipation, diarrhea, nausea, rectal pain and vomiting.  Endocrine: Negative for cold intolerance, heat intolerance, polydipsia, polyphagia and polyuria.  Genitourinary: Negative for decreased urine volume, difficulty urinating, dyspareunia, dysuria, enuresis, flank pain, frequency, genital sores, hematuria, pelvic pain and urgency.  Musculoskeletal: Positive for arthralgias, back pain and gait problem. Negative for joint swelling, myalgias, neck pain and neck stiffness.  Skin: Negative for color change, pallor and rash.  Allergic/Immunologic: Negative for environmental allergies, food allergies and immunocompromised state.  Neurological: Negative for dizziness, tremors, seizures, syncope, facial asymmetry, speech difficulty, weakness, light-headedness, numbness and headaches.  Hematological: Negative for adenopathy. Does not bruise/bleed easily.  Psychiatric/Behavioral: Negative for agitation, behavioral problems, confusion, decreased concentration, dysphoric mood, hallucinations, self-injury, sleep disturbance and suicidal ideas. The patient is not nervous/anxious and is not hyperactive.      Objective: Vital Signs: BP 127/81   Pulse 85   Ht  5\' 4"  (1.626 m)   Wt 110 lb (49.9 kg)   BMI 18.88 kg/m   Physical Exam Constitutional:      Appearance: She is well-developed.  HENT:     Head: Normocephalic and atraumatic.  Eyes:     Pupils: Pupils are equal, round, and reactive to light.  Neck:     Musculoskeletal: Normal range of motion and neck supple.  Pulmonary:     Effort: Pulmonary effort is normal.     Breath sounds: Normal breath sounds.  Abdominal:     General: Bowel sounds are normal.     Palpations: Abdomen is soft.  Skin:    General: Skin is warm and dry.  Neurological:     Mental Status: She is alert and oriented to person, place, and time.  Psychiatric:        Behavior: Behavior normal.        Thought Content: Thought content normal.        Judgment: Judgment normal.     Back Exam   Tenderness  The patient is experiencing tenderness in the cervical, lumbar and thoracic.  Range of Motion  Extension: abnormal  Flexion: normal  Lateral bend right: normal  Lateral bend left: normal  Rotation right: normal  Rotation left: normal   Muscle Strength  Right Quadriceps:  5/5  Left Quadriceps:  5/5  Right Hamstrings:  5/5  Left Hamstrings:  5/5   Reflexes  Patellar: 3/4 Achilles: 2/4 Biceps: normal Babinski's sign: normal   Other  Toe walk: normal Heel walk: normal Sensation: normal Gait: normal  Erythema: no back redness  Comments:  Hyperreflexia in the legs bilateral at the patella with pathologic spread to the opposite leg.  No focal weakness. Stooped in standing, extends to neutral while standing. Flattening of the thoracolumbar spine with right lower rib and right lumbar hump. List of the spine to the left.  Heel and toe walking intact.  Upper extremity motor is normal.  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 Loss of left thumb-index spase.        Specialty Comments:   No specialty comments available.  Imaging: No results found.   PMFS History: Patient Active Problem List   Diagnosis Date Noted  . Scoliosis of lumbar spine 01/26/2019  . Lumbar facet arthropathy 01/26/2019  . Other intervertebral disc degeneration, lumbar region 01/26/2019  . CHRONIC OSTEOMYELITIS ANKLE AND FOOT 07/06/2010  . DEPRESSION 10/30/2008  . ABSCESS, BACK 10/30/2008  . PNEUMONIA, HX OF 10/30/2008   Past Medical History:  Diagnosis Date  . Animal bite 01/30/2013   LEFT HAND  . Arthritis     Family History  Family history unknown: Yes    Past Surgical History:  Procedure Laterality Date  . HAND SURGERY    . I&D EXTREMITY Left 01/31/2013   Procedure: IRRIGATION AND DEBRIDEMENT EXTREMITY;  Surgeon: Tami RibasKevin R Kuzma, MD;  [facility];  Service: Orthopedics;  Laterality: Left;  . INCISION / DRAINAGE HAND / FINGER Left 01/31/2013   Social History   Occupational History  . Not on file  Tobacco Use  . Smoking status: Current Every Day Smoker    Packs/day: 1.00    Years: 30.00    Pack years: 30.00    Types: Cigarettes  . Smokeless tobacco: Never Used  Substance and Sexual Activity  . Alcohol use: No  . Drug use: Not Currently    Comment: Methadone maintenance program  . Sexual activity: Not on file

## 2019-01-28 ENCOUNTER — Telehealth: Payer: Self-pay | Admitting: Orthopaedic Surgery

## 2019-01-28 ENCOUNTER — Telehealth: Payer: Self-pay | Admitting: Nurse Practitioner

## 2019-01-28 NOTE — Telephone Encounter (Signed)
I called and spoke with patient's son. He would like to pick up CD of MRI that Dr. Louanne Skye reviewed with patient yesterday and take it with him to [place]. He knows that he will be responsible for getting the CD back to [place] after her appt with [facility]. He does not want to drive back to [facility] for another CD.  I advised we could put CD at front for pick up.

## 2019-01-28 NOTE — Telephone Encounter (Signed)
Patient son called in requesting a copy of the MRI CD to give to [place]. Stated he would like for Dr. Lorin Mercy to keep a copy. Told him I didn't think a copy being made was possible. Please advise

## 2019-01-28 NOTE — Telephone Encounter (Signed)
Phone call to patient to verify medication list and allergies for myelogram procedure. Spoke with pt's son. Pt's son informed she will not need to hold any medications for this procedure. Pre and post procedure instructions reviewed with pt. Pt's son verbalized understanding.

## 2019-01-28 NOTE — Telephone Encounter (Signed)
I called patient's son and advised Dr. Louanne Skye has CD. Patient aware we will call him when CD is here and ready for pick up.

## 2019-01-29 NOTE — Telephone Encounter (Signed)
I called and lmom for [name] that the CD is at the front desk

## 2019-02-28 ENCOUNTER — Ambulatory Visit
Admission: RE | Admit: 2019-02-28 | Discharge: 2019-02-28 | Disposition: A | Discharge status: Home / Self Care | Payer: BLUE CROSS/BLUE SHIELD | Source: Ambulatory Visit | Attending: Specialist | Admitting: Specialist

## 2019-02-28 ENCOUNTER — Other Ambulatory Visit: Payer: Self-pay | Admitting: Specialist

## 2019-02-28 ENCOUNTER — Ambulatory Visit
Admission: RE | Admit: 2019-02-28 | Discharge: 2019-02-28 | Disposition: A | Discharge status: Home / Self Care | Payer: Self-pay | Source: Ambulatory Visit | Attending: Specialist | Admitting: Specialist

## 2019-02-28 DIAGNOSIS — M419 Scoliosis, unspecified: Secondary | ICD-10-CM

## 2019-02-28 DIAGNOSIS — R52 Pain, unspecified: Secondary | ICD-10-CM

## 2019-02-28 DIAGNOSIS — M4807 Spinal stenosis, lumbosacral region: Secondary | ICD-10-CM

## 2019-02-28 DIAGNOSIS — M542 Cervicalgia: Secondary | ICD-10-CM

## 2019-02-28 MED ORDER — IOPAMIDOL (ISOVUE-M 300) INJECTION 61%
20.0000 mL | Freq: Once | INTRAMUSCULAR | Status: AC | PRN
  Administered 2019-02-28: 14:00:00 20 mL via INTRATHECAL

## 2019-02-28 NOTE — Discharge Instructions (Signed)

## 2019-03-10 ENCOUNTER — Telehealth: Payer: Self-pay | Admitting: *Deleted

## 2019-03-10 NOTE — Telephone Encounter (Signed)
Pt husband called stating [facility] called to schedule appt and found out she [employer] with [facility] and could not schedule with them, pt is requesting pt to be scheduled with [facility] in [place], order has been faxed, [facility] will contact pt to schedule appt.

## 2019-04-04 ENCOUNTER — Ambulatory Visit: Payer: BLUE CROSS/BLUE SHIELD | Admitting: Specialist

## 2019-04-04 ENCOUNTER — Encounter: Payer: Self-pay | Admitting: Specialist

## 2019-04-18 ENCOUNTER — Ambulatory Visit (INDEPENDENT_AMBULATORY_CARE_PROVIDER_SITE_OTHER): Payer: BLUE CROSS/BLUE SHIELD | Admitting: Specialist

## 2019-04-18 ENCOUNTER — Encounter: Payer: Self-pay | Admitting: Specialist

## 2019-04-18 VITALS — BP 142/80 | HR 74 | Ht 64.0 in | Wt 110.0 lb

## 2019-04-18 DIAGNOSIS — M5416 Radiculopathy, lumbar region: Secondary | ICD-10-CM | POA: Diagnosis not present

## 2019-04-18 DIAGNOSIS — M4156 Other secondary scoliosis, lumbar region: Secondary | ICD-10-CM | POA: Diagnosis not present

## 2019-04-18 DIAGNOSIS — M48062 Spinal stenosis, lumbar region with neurogenic claudication: Secondary | ICD-10-CM

## 2019-04-18 DIAGNOSIS — M8588 Other specified disorders of bone density and structure, other site: Secondary | ICD-10-CM

## 2019-04-18 NOTE — Patient Instructions (Signed)
Plan:Avoid bending, stooping and avoid lifting weights greater than 10 lbs. Avoid prolong standing and walking. Avoid frequent bending and stooping  No lifting greater than 10 lbs. May use ice or moist heat for pain. Weight loss is of benefit. Handicap license is approved. You are totally disabled due to your spine, surgery recommended is a 7 level lumbar fusion from T10 to S1 with iliac wing screws and pedicle screws and rods and cages at L1-2, L2-3, L3-4, L4-5 and L5-S1. You also have osteopenia and have a higher risk of hardware failure or loosening. You smoke and have a higher risk of not healing the long segment fusion from T10 to S1. Continued use of narcotics is appropriate. I support your application for total disability on a full time basis for the remainder of your life.

## 2019-04-18 NOTE — Progress Notes (Signed)
Office Visit Note   Patient: Jessica Vance           Date of Birth: 02-08-1961           MRN: [id number] Visit Date: 04/18/2019              Requested by: No referring provider defined for this encounter. PCP: Patient, No Pcp Per   Assessment & Plan: Visit Diagnoses:  1. Other secondary scoliosis, lumbar region   2. Spinal stenosis of lumbar region with neurogenic claudication   3. Right lumbar radiculopathy   4. Osteopenia of lumbar spine     Plan:Avoid bending, stooping and avoid lifting weights greater than 10 lbs. Avoid prolong standing and walking. Avoid frequent bending and stooping  No lifting greater than 10 lbs. May use ice or moist heat for pain. Weight loss is of benefit. Handicap license is approved. You are totally disabled due to your spine, surgery recommended is a 7 level lumbar fusion from T10 to S1 with iliac wing screws and pedicle screws and rods and cages at L1-2, L2-3, L3-4, L4-5 and L5-S1. You also have osteopenia and have a higher risk of hardware failure or loosening. You smoke and have a higher risk of not healing the long segment fusion from T10 to S1. Continued use of narcotics is appropriate. I support your application for total disability on a full time basis for the remainder of your life.  Follow-Up Instructions: No follow-ups on file.   Orders:  No orders of the defined types were placed in this encounter.  No orders of the defined types were placed in this encounter.     Procedures: No procedures performed   Clinical Data: No additional findings.   Subjective: Chief Complaint  Patient presents with  . Spine - Follow-up    Myelogram total spine    58 year old female with history of lumbar scoliosis and right lumbar radicular pain right lateral and anterior thigh and knee and anterior shin and is worse with standing and walking. Pain with sittng and lying and standing and walking. Sitting to improve chance of falling. Stooped  gait. Uses a cart to push when at the grocery store. 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 No bowel or bladder difficulty. Worsening pain at night. Taking methadone and ibuprofen for pain.    Review of Systems  Constitutional: Negative.   HENT: Negative.   Eyes: Negative.   Respiratory: Negative.   Cardiovascular: Negative.   Gastrointestinal: Negative.   Endocrine: Negative.   Genitourinary: Negative.   Musculoskeletal: Negative.   Skin: Negative.   Allergic/Immunologic: Negative.   Neurological: Negative.   Hematological: Negative.   Psychiatric/Behavioral: Negative.      Objective: Vital Signs: There were no vitals taken for this visit.  Physical Exam Constitutional:      Appearance: She is well-developed.  HENT:     Head: Normocephalic and atraumatic.  Eyes:     Pupils: Pupils are equal, round, and reactive to light.  Neck:     Musculoskeletal: Normal range of motion and neck supple.  Pulmonary:     Effort: Pulmonary effort is normal.     Breath sounds: Normal breath sounds.  Abdominal:     General: Bowel sounds are normal.     Palpations: Abdomen is soft.  Skin:    General: Skin is warm and dry.  Neurological:     Mental Status: She is alert and oriented  to person, place, and time.  Psychiatric:        Behavior: Behavior normal.        Thought Content: Thought content normal.        Judgment: Judgment normal.     Back Exam   Tenderness  The patient is experiencing tenderness in the lumbar.  Range of Motion  Extension: abnormal  Flexion: abnormal  Lateral bend right: abnormal  Lateral bend left: abnormal  Rotation right: abnormal  Rotation left: abnormal   Muscle Strength  Right Quadriceps:  5/5  Left Quadriceps:  5/5  Right Hamstrings:  5/5  Left Hamstrings:  5/5   Tests  Straight leg raise right: negative Straight leg raise left: negative  Reflexes  Patellar: 2/4 Achilles: 2/4 Babinski's  sign: normal   Other  Toe walk: normal Heel walk: normal Sensation: normal Gait: normal  Erythema: no back redness Scars: absent      Specialty Comments:  No specialty comments available.  Imaging: No results found.   PMFS History: Patient Active Problem List   Diagnosis Date Noted  . Scoliosis of lumbar spine 01/26/2019  . Lumbar facet arthropathy 01/26/2019  . Other intervertebral disc degeneration, lumbar region 01/26/2019  . CHRONIC OSTEOMYELITIS ANKLE AND FOOT 07/06/2010  . DEPRESSION 10/30/2008  . ABSCESS, BACK 10/30/2008  . PNEUMONIA, HX OF 10/30/2008   Past Medical History:  Diagnosis Date  . Animal bite 01/30/2013   LEFT HAND  . Arthritis     Family History  Family history unknown: Yes    Past Surgical History:  Procedure Laterality Date  . HAND SURGERY    . I&D EXTREMITY Left 01/31/2013   Procedure: IRRIGATION AND DEBRIDEMENT EXTREMITY;  Surgeon: Tami RibasKevin R Kuzma, MD;  [facility];  Service: Orthopedics;  Laterality: Left;  . INCISION / DRAINAGE HAND / FINGER Left 01/31/2013   Social History   Occupational History  . Not on file  Tobacco Use  . Smoking status: Current Every Day Smoker    Packs/day: 1.00    Years: 30.00    Pack years: 30.00    Types: Cigarettes  . Smokeless tobacco: Never Used  Substance and Sexual Activity  . Alcohol use: No  . Drug use: Not Currently    Comment: Methadone maintenance program  . Sexual activity: Not on file

## 2019-04-24 ENCOUNTER — Ambulatory Visit: Payer: BLUE CROSS/BLUE SHIELD | Admitting: Surgery

## 2019-05-01 ENCOUNTER — Encounter: Payer: Self-pay | Admitting: Surgery

## 2019-05-01 ENCOUNTER — Ambulatory Visit (INDEPENDENT_AMBULATORY_CARE_PROVIDER_SITE_OTHER): Payer: BLUE CROSS/BLUE SHIELD | Admitting: Surgery

## 2019-05-01 DIAGNOSIS — M48062 Spinal stenosis, lumbar region with neurogenic claudication: Secondary | ICD-10-CM

## 2019-05-01 NOTE — Progress Notes (Signed)
There is some confusion about patient's appointment today.  Patient had told our clinical assistant that she thought she was coming in to have an injection.  Injection was not discussed previously with Dr. Louanne Skye.  Dr. Louanne Skye had planned on speaking with the patient in regards to today's visit but she stated to [facility] that she has an appointment with him in a couple of weeks and instead of talking to him today she will just wait for that appointment.

## 2019-05-19 ENCOUNTER — Encounter: Payer: Self-pay | Admitting: Specialist

## 2019-05-19 ENCOUNTER — Ambulatory Visit (INDEPENDENT_AMBULATORY_CARE_PROVIDER_SITE_OTHER): Payer: BLUE CROSS/BLUE SHIELD | Admitting: Specialist

## 2019-05-19 VITALS — BP 117/77 | HR 96 | Ht 64.0 in | Wt 110.0 lb

## 2019-05-19 DIAGNOSIS — M48062 Spinal stenosis, lumbar region with neurogenic claudication: Secondary | ICD-10-CM

## 2019-05-19 DIAGNOSIS — M4156 Other secondary scoliosis, lumbar region: Secondary | ICD-10-CM | POA: Diagnosis not present

## 2019-05-19 MED ORDER — GABAPENTIN 300 MG PO CAPS: ORAL_CAPSULE | ORAL | 0 refills | Status: DC

## 2019-05-19 MED ORDER — OXYCODONE-ACETAMINOPHEN 5-325 MG PO TABS: 1.0000 | ORAL_TABLET | Freq: Four times a day (QID) | ORAL | 0 refills | Status: DC | PRN

## 2019-05-19 NOTE — Progress Notes (Signed)
Office Visit Note   Patient: Jessica Vance           Date of Birth: 20-Oct-1960           MRN: [id number] Visit Date: 05/19/2019              Requested by: No referring provider defined for this encounter. PCP: Patient, No Pcp Per   Assessment & Plan: Visit Diagnoses: No diagnosis found.  Plan: Avoid bending, stooping and avoid lifting weights greater than 10 lbs. Avoid prolong standing and walking. Order for a new walker with wheels. Surgery scheduling secretary Tivis Ringer, will call you in the next week to schedule for surgery.  Surgery recommended is a 8 level lumbar fusion from T10 to S1 this would be done with rods, screws and cages with local bone graft and allograft (donor bone graft). Take hydrocodone for for pain. Risk of surgery includes risk of infection 1 in 200 patients, bleeding 1/2% chance you would need a transfusion.   Risk to the nerves is one in 10,000. You will need to use a brace for 3 months and wean from the brace on the 4th month. Expect improved walking and standing tolerance. Expect relief of leg pain but numbness may persist depending on the length and degree of pressure that has been present.    Follow-Up Instructions: No follow-ups on file.   Orders:  No orders of the defined types were placed in this encounter.  No orders of the defined types were placed in this encounter.     Procedures: No procedures performed   Clinical Data: No additional findings.   Subjective: Chief Complaint  Patient presents with   Lower Back - Follow-up    58 year old female with history of thoracolumbar scoliosis and collapsing degenerative disc disease. She reports her pain is severe and she has had to stop work and is absent from work. Her son is working a trucking job to try and help her. He is 89 or 58 years old. Works all night then sleeps during the day and brings his mother food and helps to care for her. Her bowel and bladder are normal. She has  trouble standing and walking with severe pain that is present in the small of her back and radiates down the right lateral thigh and calf. She is taking methadone, ibuprofen and tylenol. She requests narcotic pain meds for relief of pain.    Review of Systems  Constitutional: Negative.  Negative for activity change, appetite change, chills, diaphoresis, fatigue, fever and unexpected weight change.  HENT: Negative.  Negative for congestion, dental problem, drooling, ear discharge, ear pain, facial swelling, hearing loss, mouth sores, nosebleeds, postnasal drip, rhinorrhea, sinus pressure, sinus pain, sneezing, sore throat, tinnitus, trouble swallowing and voice change.   Eyes: Negative.  Negative for photophobia, pain, discharge, redness, itching and visual disturbance.  Respiratory: Negative.  Negative for apnea, cough, choking, chest tightness, shortness of breath, wheezing and stridor.   Cardiovascular: Negative.  Negative for chest pain, palpitations and leg swelling.  Gastrointestinal: Negative.  Negative for abdominal distention, abdominal pain, anal bleeding, blood in stool, constipation, diarrhea, nausea, rectal pain and vomiting.  Endocrine: Negative.  Negative for cold intolerance, heat intolerance, polydipsia, polyphagia and polyuria.  Genitourinary: Negative.  Negative for difficulty urinating, dyspareunia, dysuria, enuresis, flank pain, frequency, genital sores, hematuria and urgency.  Musculoskeletal: Positive for back pain and gait problem. Negative for arthralgias, joint swelling, myalgias, neck pain and neck stiffness.  Skin: Negative.  Negative for color change, pallor, rash and wound.  Allergic/Immunologic: Negative.  Negative for environmental allergies, food allergies and immunocompromised state.  Neurological: Positive for weakness and numbness. Negative for dizziness, tremors, seizures, syncope, facial asymmetry, speech difficulty, light-headedness and headaches.  Hematological:  Negative.  Negative for adenopathy. Does not bruise/bleed easily.  Psychiatric/Behavioral: Negative.  Negative for agitation, behavioral problems, confusion, decreased concentration, dysphoric mood, hallucinations, self-injury, sleep disturbance and suicidal ideas. The patient is not nervous/anxious and is not hyperactive.      Objective: Vital Signs: BP 117/77 (BP Location: Left Arm, Patient Position: Sitting)    Pulse 96    Ht 5\' 4"  (1.626 m)    Wt 110 lb (49.9 kg)    BMI 18.88 kg/m   Physical Exam  Ortho Exam  Specialty Comments:  No specialty comments available.  Imaging: No results found.   PMFS History: Patient Active Problem List   Diagnosis Date Noted   Scoliosis of lumbar spine 01/26/2019   Lumbar facet arthropathy 01/26/2019   Other intervertebral disc degeneration, lumbar region 01/26/2019   CHRONIC OSTEOMYELITIS ANKLE AND FOOT 07/06/2010   DEPRESSION 10/30/2008   ABSCESS, BACK 10/30/2008   PNEUMONIA, HX OF 10/30/2008   Past Medical History:  Diagnosis Date   Animal bite 01/30/2013   LEFT HAND   Arthritis     Family History  Family history unknown: Yes    Past Surgical History:  Procedure Laterality Date   HAND SURGERY     I&D EXTREMITY Left 01/31/2013   Procedure: IRRIGATION AND DEBRIDEMENT EXTREMITY;  Surgeon: Tennis Must, MD;  Location: [facility];  Service: Orthopedics;  Laterality: Left;   INCISION / DRAINAGE HAND / FINGER Left 01/31/2013   Social History   Occupational History   Not on file  Tobacco Use   Smoking status: Current Every Day Smoker    Packs/day: 1.00    Years: 30.00    Pack years: 30.00    Types: Cigarettes   Smokeless tobacco: Never Used  Substance and Sexual Activity   Alcohol use: No   Drug use: Not Currently    Comment: Methadone maintenance program   Sexual activity: Not on file

## 2019-05-21 ENCOUNTER — Ambulatory Visit: Payer: BLUE CROSS/BLUE SHIELD | Admitting: Specialist

## 2019-05-21 ENCOUNTER — Ambulatory Visit: Payer: BLUE CROSS/BLUE SHIELD | Admitting: Surgery

## 2019-05-29 ENCOUNTER — Other Ambulatory Visit: Payer: Self-pay | Admitting: Specialist

## 2019-05-29 NOTE — Telephone Encounter (Signed)
Patient's son [name] called advised his mother need Rx refilled Hydrocodone. Ricky asked if the strength can be a little stronger because his mother have to take 2 tabs to control the pain. [facility] uses the [facility] [address]	[place]. [name] also asked when can she be scheduled for surgery? The number to contact [name] is [phone]

## 2019-05-30 ENCOUNTER — Other Ambulatory Visit: Payer: Self-pay | Admitting: Specialist

## 2019-05-30 MED ORDER — OXYCODONE-ACETAMINOPHEN 5-325 MG PO TABS: 1.0000 | ORAL_TABLET | Freq: Four times a day (QID) | ORAL | 0 refills | Status: DC | PRN

## 2019-05-30 NOTE — Telephone Encounter (Signed)
Have you seen a surgery sheet for this patient?

## 2019-06-02 ENCOUNTER — Telehealth: Payer: Self-pay | Admitting: Specialist

## 2019-06-02 NOTE — Telephone Encounter (Signed)
Patient's son [name] called advised patient need her pain medicine refilled. (Oxycodone)The number to contact [name] is [phone]

## 2019-06-02 NOTE — Telephone Encounter (Signed)
I called and Jessica Vance that since it was just filled on 05/30/2019 that it was to soon to be refilled, that it will have to wait till Friday. He is going to call us back on Thursday and request a refill then.

## 2019-06-03 NOTE — Telephone Encounter (Signed)
I called and advised awaiting surgery sheet from Dr. Louanne Skye to schedule.

## 2019-06-06 ENCOUNTER — Other Ambulatory Visit: Payer: Self-pay | Admitting: Specialist

## 2019-06-06 MED ORDER — OXYCODONE-ACETAMINOPHEN 5-325 MG PO TABS: 1.0000 | ORAL_TABLET | Freq: Four times a day (QID) | ORAL | 0 refills | Status: DC | PRN

## 2019-06-06 NOTE — Telephone Encounter (Signed)
Patient's son called. [facility]. His call back number is [phone]

## 2019-06-06 NOTE — Telephone Encounter (Signed)
Sent to Dr. Nitka ° ° °

## 2019-06-11 ENCOUNTER — Telehealth: Payer: Self-pay | Admitting: Specialist

## 2019-06-11 NOTE — Telephone Encounter (Signed)
Has Dr. Louanne Skye completed a surgery sheet for her?

## 2019-06-11 NOTE — Telephone Encounter (Signed)
Patient's son called. He says she is in a lot of pain. Also they have not gotten a date for her surgery. He would like for someone to call him. His call back number is [phone]

## 2019-06-12 ENCOUNTER — Other Ambulatory Visit: Payer: Self-pay | Admitting: Specialist

## 2019-06-12 MED ORDER — OXYCODONE-ACETAMINOPHEN 5-325 MG PO TABS: 1.0000 | ORAL_TABLET | Freq: Four times a day (QID) | ORAL | 0 refills | Status: DC | PRN

## 2019-06-12 NOTE — Telephone Encounter (Signed)
Patient's son requesting an RX refill on the patient's Percocet.  He also stated that the 5mg  is not really helping the pain, it is only taking the edge off, he is wanting to know if she can get something stronger, if not, then they will have to make the [place] work until she has her surgery. He is also waiting for Sherrie to call him back in regards to scheduling her surgery.  MC#[phone].  Thank you.

## 2019-06-12 NOTE — Telephone Encounter (Signed)
No

## 2019-06-12 NOTE — Telephone Encounter (Signed)
I  Did call the son [name] and advised that I sent a refill request to Dr. Louanne Skye, as far as something stronger for pain, if we up it now then we will have problems controlling her pain after surgery.  Per [name] her walking is worsening and she needs to get the surgery done, and she is in a lot of pain.  I advised that I have put a surgery sheet on Dr. Otho Ket for him to complete and once it is done that we will work to get her scheduled as soon as we could.

## 2019-06-18 ENCOUNTER — Other Ambulatory Visit: Payer: Self-pay | Admitting: Specialist

## 2019-06-18 NOTE — Telephone Encounter (Signed)
Sent to Dr. Nitka ° ° °

## 2019-06-18 NOTE — Telephone Encounter (Signed)
Pt son Jessica Vance called in to refill his mother's prescription for Oxycodone, [facility] [address]   [phone]

## 2019-06-19 ENCOUNTER — Telehealth: Payer: Self-pay | Admitting: Specialist

## 2019-06-19 ENCOUNTER — Other Ambulatory Visit: Payer: Self-pay | Admitting: Specialist

## 2019-06-19 MED ORDER — OXYCODONE-ACETAMINOPHEN 5-325 MG PO TABS: 1.0000 | ORAL_TABLET | Freq: Four times a day (QID) | ORAL | 0 refills | Status: DC | PRN

## 2019-06-19 NOTE — Telephone Encounter (Signed)
Received voicemail message from [facility] -pharmacist concerning refill for (Percocet) 5-325. Zenia Resides advised patient is expecting another refill. Rx last filled 06/06/2019   The number to contact Zenia Resides is [phone]

## 2019-06-19 NOTE — Telephone Encounter (Signed)
This has been approved.

## 2019-06-19 NOTE — Telephone Encounter (Signed)
Patient's son called stating that the pharmacy has not received her prescription for the Oxycodone.  He wants this taken care of today.  [id number]#[phone].  Thank you.

## 2019-06-19 NOTE — Telephone Encounter (Signed)
I called and advised this was sent over

## 2019-06-25 ENCOUNTER — Other Ambulatory Visit: Payer: Self-pay | Admitting: Specialist

## 2019-06-25 NOTE — Telephone Encounter (Signed)
Request has been sent to Dr. Nitka 

## 2019-06-25 NOTE — Telephone Encounter (Signed)
Pt called in requesting a refill on Gabapentin and oxycodone, [facility] [address]   [phone]

## 2019-06-26 ENCOUNTER — Other Ambulatory Visit: Payer: Self-pay

## 2019-06-26 ENCOUNTER — Encounter: Payer: Self-pay | Admitting: Specialist

## 2019-06-26 ENCOUNTER — Ambulatory Visit (INDEPENDENT_AMBULATORY_CARE_PROVIDER_SITE_OTHER): Payer: BLUE CROSS/BLUE SHIELD | Admitting: Specialist

## 2019-06-26 ENCOUNTER — Ambulatory Visit: Payer: Self-pay

## 2019-06-26 VITALS — BP 122/79 | HR 87 | Ht 64.0 in | Wt 110.0 lb

## 2019-06-26 DIAGNOSIS — M48062 Spinal stenosis, lumbar region with neurogenic claudication: Secondary | ICD-10-CM

## 2019-06-26 DIAGNOSIS — M5416 Radiculopathy, lumbar region: Secondary | ICD-10-CM

## 2019-06-26 DIAGNOSIS — M4316 Spondylolisthesis, lumbar region: Secondary | ICD-10-CM

## 2019-06-26 DIAGNOSIS — M4156 Other secondary scoliosis, lumbar region: Secondary | ICD-10-CM | POA: Diagnosis not present

## 2019-06-26 DIAGNOSIS — M21371 Foot drop, right foot: Secondary | ICD-10-CM | POA: Diagnosis not present

## 2019-06-26 MED ORDER — OXYCODONE-ACETAMINOPHEN 5-325 MG PO TABS: 1.0000 | ORAL_TABLET | Freq: Four times a day (QID) | ORAL | 0 refills | Status: DC | PRN

## 2019-06-26 MED ORDER — GABAPENTIN 300 MG PO CAPS: 300.0000 mg | ORAL_CAPSULE | Freq: Four times a day (QID) | ORAL | 3 refills | Status: AC

## 2019-06-26 NOTE — Patient Instructions (Signed)
Plan:Avoid bending, stooping and avoid lifting weights greater than 10 lbs. Avoid prolong standing and walking. Order for a new walker with wheels. Surgery scheduling secretary Sherri Billings, will call you in the next week to schedule for surgery.  Surgery recommended is a8level lumbar fusion from T10 toS1 this would be done with rods, screws and cages with local bone graft and allograft (donor bone graft). Take hydrocodone for for pain. Risk of surgery includes risk of infection 1 in 200 patients, bleeding 1/2% chance you would need a transfusion. Risk to the nerves is one in 10,000. You will need to use a brace for 3 months and wean from the brace on the 4th month. Expect improved walking and standing tolerance. Expect relief of leg pain but numbness may persist depending on the length and degree of pressure that has been present 

## 2019-06-26 NOTE — Progress Notes (Signed)
Office Visit Note   Patient: Jessica Vance           Date of Birth: 1960-09-19           MRN: [id number] Visit Date: 06/26/2019              Requested by: No referring provider defined for this encounter. PCP: Patient, No Pcp Per   Assessment & Plan: Visit Diagnoses:  1. Other secondary scoliosis, lumbar region   2. Spinal stenosis of lumbar region with neurogenic claudication   3. Foot drop, right   4. Right lumbar radiculopathy     Plan: Plan: Avoid bending, stooping and avoid lifting weights greater than 10 lbs. Avoid prolong standing and walking. Order for a new walker with wheels. Surgery scheduling secretary Kandice Hams, will call you in the next week to schedule for surgery.  Surgery recommended is a 8 level lumbar fusion from T10 to S1 this would be done with rods, screws and cages with local bone graft and allograft (donor bone graft). Take hydrocodone for for pain. Risk of surgery includes risk of infection 1 in 200 patients, bleeding 1/2% chance you would need a transfusion.   Risk to the nerves is one in 10,000. You will need to use a brace for 3 months and wean from the brace on the 4th month. Expect improved walking and standing tolerance. Expect relief of leg pain but numbness may persist depending on the length and degree of pressure that has been present  Follow-Up Instructions: Return in about 4 weeks (around 07/24/2019).   Orders:  Orders Placed This Encounter  Procedures  . XR SCOLIOSIS EVAL COMPLETE SPINE 2 OR 3 VIEWS   No orders of the defined types were placed in this encounter.     Procedures: No procedures performed   Clinical Data: Findings:  CLINICAL DATA:  Chronic right-sided low back pain radiating into the lateral right thigh and shin. Occasional radiation to the left groin. Hyper-reflexia. No arm radiculopathy. No prior surgery.  EXAM: CERVICAL, THORACIC, AND LUMBAR MYELOGRAM  CT CERVICAL MYELOGRAM  CT THORACIC MYELOGRAM   CT LUMBAR MYELOGRAM  FLUOROSCOPY TIME:  Radiation Exposure Index (as provided by the fluoroscopic device): 21.3 mGy  Fluoroscopy Time:  47 seconds  Number of Acquired Images:  33  PROCEDURE: LUMBAR PUNCTURE FOR CERVICAL, THORACIC, AND LUMBAR MYELOGRAM  After thorough discussion of risks and benefits of the procedure including bleeding, infection, injury to nerves, blood vessels, adjacent structures as well as headache and CSF leak, written and oral informed consent was obtained. Consent was obtained by Dr. Fabiola Backer.  Patient was positioned prone on the fluoroscopy table. Local anesthesia was provided with 1% lidocaine without epinephrine after prepped and draped in the usual sterile fashion. Puncture was performed at L2-L3 using a 3 1/2 inch 22-gauge spinal needle via right interlaminar approach. Using a single pass through the dura, the needle was placed within the thecal sac, with return of clear CSF. 10 mL Isovue M-300 was injected into the thecal sac, with normal opacification of the nerve roots and cauda equina consistent with free flow within the subarachnoid space. The patient was then moved to the trendelenburg position and contrast flowed into the thoracic and cervical spine regions.  I personally performed the lumbar puncture and administered the intrathecal contrast. I also personally supervised acquisition of the myelogram images.  TECHNIQUE: Contiguous axial images were obtained through the cervical, thoracic, and lumbar spine after the intrathecal infusion of contrast. Coronal and sagittal  reconstructions were obtained of the axial image sets.  COMPARISON:  MRI lumbar spine dated Jan 18, 2019. Chest x-ray dated April 27, 2018.  FINDINGS: CERVICAL, THORACIC, AND LUMBAR MYELOGRAM FINDINGS:  Cervical spine: Trace anterolisthesis at C3-C4 and C4-C5. No dynamic instability. Small ventral extradural defects from C3-C4 through C6-C7. Mild  spinal canal stenosis at C5-C6. Underfilling of the left C6 and C7 nerve roots.  Thoracic spine: Normal alignment. Scattered small ventral extradural defects. No spinal canal stenosis or nerve root underfilling.  Lumbar spine: Severe dextroscoliosis. 7 mm left lateral translation of L1 on L2 when prone increases to 9 mm when standing or bending to the right or left. 9 mm right lateral translation of L3 on L4 when prone increases to 11 mm when standing or bending to the right or left.  Trace retrolisthesis at L2-L3 and L3-L4. Multilevel ventral extradural defects from L1-L2 to L4-L5, largest at L4-L5. Severe spinal canal stenosis at L4-L5. Effacement of the left L4 and right L5 nerve roots. Underfilling of the right L4 nerve root.  CT CERVICAL MYELOGRAM FINDINGS:  Alignment: 2 mm anterolisthesis at C3-C4 and C4-C5. Reversal of the normal cervical lordosis centered at C5-C6.  Skull base and vertebrae: No acute fracture or other focal pathologic process.  Cord: Normal in bulk and morphology.  Soft tissues: Negative.  Disc levels:  C2-C3: Mild disc height loss without significant disc bulge or herniation. No stenosis.  C3-C4: Mild disc height loss without significant disc bulge or herniation. Mild left greater than right facet arthropathy. No stenosis.  C4-C5: Mild disc bulging. Mild to moderate left facet arthropathy. Mild right neuroforaminal stenosis. No spinal canal or left neuroforaminal stenosis.  C5-C6: Severe disc height loss with small broad-based posterior disc osteophyte complex. Moderate left and mild right uncovertebral hypertrophy. Moderate right facet arthropathy. Mild spinal canal stenosis. Severe left and mild right neuroforaminal stenosis.  C6-C7: Severe disc height loss with small broad-based posterior disc osteophyte complex. Moderate to severe left uncovertebral hypertrophy. Mild to moderate left neuroforaminal stenosis. No spinal canal  or right neuroforaminal stenosis.  C7-T1: No significant disc bulge or herniation. Tiny left paracentral osteophyte. No stenosis.  CT THORACIC MYELOGRAM FINDINGS:  Alignment: Slight levocurvature.  Trace retrolisthesis at T11-T12.  Vertebrae: No acute fracture or other focal pathologic process. Scattered small Schmorl's nodes.  Cord: Normal in bulk and morphology.  Paraspinal and other soft tissues: Mild centrilobular emphysema.  Disc levels:  T1-T2: Negative.  T2-T3: Small central disc protrusion.  No stenosis.  T3-T4: Negative disc. Mild bilateral facet arthropathy. Borderline mild right neuroforaminal stenosis. No spinal canal or left neuroforaminal stenosis.  T4-T5: Negative disc. Mild bilateral facet arthropathy. No stenosis.  T5-T6: Negative disc. Mild bilateral facet arthropathy. No stenosis.  T6-T7: Small left paracentral disc protrusion with slight flattening of the left ventral cord. No stenosis.  T7-T8: Negative.  T8-T9: Negative.  T9-T10: Small central disc protrusion.  No stenosis.  T10-T11: Small central disc protrusion. Mild bilateral facet arthropathy. No stenosis.  T11-T12: Mild disc bulging.  No stenosis.  CT LUMBAR MYELOGRAM FINDINGS:  Segmentation: Standard.  Alignment: Dextroscoliosis, apex at L2-L3. 7 mm left lateral translation of L1 on L2. 6 mm right lateral translation of L3 on L4. 3 mm retrolisthesis at L2-L3. Trace retrolisthesis at L3-L4.  Vertebrae: No acute fracture or other focal pathologic process.  Conus medullaris and cauda equina: Conus extends to the L1 level. Clumping of the cauda equina nerve roots due to downstream stenosis.  Paraspinal and other soft tissues:  Punctate bilateral nonobstructive renal calculi. Aortoiliac atherosclerotic vascular disease.  Disc levels:  T12-L1:  Negative.  L1-L2: Mild asymmetric disc height loss and bulging, greater on the left. Mild-to-moderate right facet  arthropathy. Mild left lateral recess stenosis. No spinal canal or neuroforaminal stenosis.  L2-L3: Severe left-sided disc height loss with large far lateral endplate osteophytes. Mild left facet arthropathy. Mild left lateral recess stenosis. No spinal canal or neuroforaminal stenosis.  L3-L4: Diffuse disc bulging with asymmetric disc height loss, greater on the left. Moderate bilateral facet arthropathy. Moderate to severe spinal canal stenosis. Moderate to severe left and moderate right lateral recess stenosis. Mild left neuroforaminal stenosis. No right neuroforaminal stenosis.  L4-L5: Diffuse disc bulging asymmetric to the right. Severe right and moderate left facet arthropathy. Severe spinal canal and right lateral recess stenosis. Moderate left lateral recess stenosis. Severe right neuroforaminal stenosis. No left neuroforaminal stenosis.  L5-S1: Severe disc height loss with small circumferential disc osteophyte complex eccentric to the right. Mild to moderate right facet arthropathy. Mild to moderate left lateral recess stenosis. Moderate right neuroforaminal stenosis. No spinal canal or left neuroforaminal stenosis.  IMPRESSION: Cervical spine:  1. Multilevel cervical spondylosis as described above. Mild spinal canal and severe left neuroforaminal stenosis at C5-C6.  Thoracic spine:  1. Mild multilevel thoracic spondylosis as described above. Slight flattening of the left ventral cord at T6-T7 due to small left paracentral disc protrusion. No stenosis or impingement. 2.  Emphysema (ICD10-J43.9).  Lumbar spine:  1. Lumbar dextroscoliosis with advanced multilevel spondylosis as described above. Severe spinal canal, right lateral recess, and right neuroforaminal stenosis at L4-L5, likely accounting for the patient's radiculopathy. 2. Moderate to severe spinal canal and left lateral recess stenosis at L3-L4. 3. Left lateral translation at L1-L2 and right  lateral translation at L3-L4 mildly increase with standing or bending. 4. Punctate bilateral nonobstructive nephrolithiasis. 5.  Aortic atherosclerosis (ICD10-I70.0).   Electronically Signed   By: Obie Dredge M.D.   On: 02/28/2019 15:52     Subjective: Chief Complaint  Patient presents with  . Lower Back - Follow-up    58 year old female with history kyphoscoliosis of the thoracolumbar spine. She reports she is ready to have surgery on her back. She fell recently outside of her house when her right leg gave away. The left thigh is starting to hurt but mainly in the left anterior proximal thigh. Has to sit down and get the weight off her left leg to relieve the pain. She reports the pain meds, oxycodone doesn't seem to be relieving the pain that well. Her son Clide Cliff is calling weekly and is going to pick up her meds. 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    Review of Systems  Constitutional: Negative.  Negative for activity change, appetite change, chills, diaphoresis, fatigue, fever and unexpected weight change.  HENT: Positive for hearing loss. Negative for congestion, dental problem, drooling, ear discharge, ear pain, facial swelling, mouth sores, nosebleeds, postnasal drip, rhinorrhea, sinus pressure, sinus pain, sneezing, sore throat, tinnitus, trouble swallowing and voice change.   Eyes: Negative.  Negative for photophobia, pain, discharge, redness, itching and visual disturbance.  Respiratory: Negative.  Negative for apnea, cough, choking, chest tightness, shortness of breath, wheezing and stridor.   Cardiovascular: Negative.  Negative for chest pain, palpitations and leg swelling.  Gastrointestinal: Negative.  Negative for abdominal distention, abdominal pain, anal bleeding, blood in stool, constipation, diarrhea, nausea, rectal  pain and vomiting.  Endocrine: Negative.  Negative for  cold intolerance, heat intolerance, polydipsia, polyphagia and polyuria.  Genitourinary: Negative.  Negative for difficulty urinating, dyspareunia, dysuria, enuresis, flank pain, frequency, genital sores, hematuria, pelvic pain and urgency.  Musculoskeletal: Positive for back pain and gait problem. Negative for arthralgias, joint swelling, myalgias, neck pain and neck stiffness.  Skin: Negative.  Negative for color change, pallor, rash and wound.  Allergic/Immunologic: Negative.  Negative for environmental allergies, food allergies and immunocompromised state.  Neurological: Positive for weakness and numbness. Negative for dizziness, tremors, seizures, syncope, facial asymmetry, speech difficulty, light-headedness and headaches.  Hematological: Negative for adenopathy. Bruises/bleeds easily.  Psychiatric/Behavioral: Negative.  Negative for agitation, behavioral problems, confusion, decreased concentration, dysphoric mood, hallucinations, self-injury, sleep disturbance and suicidal ideas. The patient is not nervous/anxious and is not hyperactive.      Objective: Vital Signs: BP 122/79 (BP Location: Left Arm, Patient Position: Sitting)   Pulse 87   Ht 5\' 4"  (1.626 m)   Wt 110 lb (49.9 kg)   BMI 18.88 kg/m   Physical Exam Constitutional:      Appearance: She is well-developed.  HENT:     Head: Normocephalic and atraumatic.  Eyes:     Pupils: Pupils are equal, round, and reactive to light.  Neck:     Musculoskeletal: Normal range of motion and neck supple.  Pulmonary:     Effort: Pulmonary effort is normal.     Breath sounds: Normal breath sounds.  Abdominal:     General: Bowel sounds are normal.     Palpations: Abdomen is soft.  Skin:    General: Skin is warm and dry.  Neurological:     Mental Status: She is alert and oriented to person, place, and time.  Psychiatric:        Behavior: Behavior normal.        Thought Content: Thought content normal.        Judgment: Judgment  normal.     Back Exam   Tenderness  The patient is experiencing tenderness in the cervical and lumbar.  Range of Motion  Extension: abnormal  Flexion: abnormal  Lateral bend right: abnormal  Lateral bend left: abnormal  Rotation right: abnormal  Rotation left: abnormal   Muscle Strength  Right Quadriceps:  5/5  Left Quadriceps:  5/5  Right Hamstrings:  5/5  Left Hamstrings:  5/5   Tests  Straight leg raise right: negative Straight leg raise left: negative  Reflexes  Patellar: 1/4 Achilles: 1/4 Babinski's sign: normal   Other  Toe walk: abnormal Heel walk: abnormal Sensation: decreased Gait: drop-foot  Erythema: no back redness Scars: absent  Comments:  Right foot drop dorsiflexion is 3/5, right foot eversion weakness 3/5, right plantar flexion is 3/5 left foot DF and PF is 5/5. RIght knee extension is 5-/5      Specialty Comments:  No specialty comments available.  Imaging: Xr Scoliosis Eval Complete Spine 2 Or 3 Views  Result Date: 06/26/2019 AP standing and lateral scoliosis views of the thoracolumbar spine with a kyphosis of the lumbar spine and right lateral listhesis of L4-5 and significant rotational scoliosis primarily L2 to L5. DDD L1-2 through L5-S1.     PMFS History: Patient Active Problem List   Diagnosis Date Noted  . Scoliosis of lumbar spine 01/26/2019  . Lumbar facet arthropathy 01/26/2019  . Other intervertebral disc degeneration, lumbar region 01/26/2019  . CHRONIC OSTEOMYELITIS ANKLE AND FOOT 07/06/2010  . DEPRESSION 10/30/2008  . ABSCESS, BACK  10/30/2008  . PNEUMONIA, HX OF 10/30/2008   Past Medical History:  Diagnosis Date  . Animal bite 01/30/2013   LEFT HAND  . Arthritis     Family History  Family history unknown: Yes    Past Surgical History:  Procedure Laterality Date  . HAND SURGERY    . I&D EXTREMITY Left 01/31/2013   Procedure: IRRIGATION AND DEBRIDEMENT EXTREMITY;  Surgeon: Tami RibasKevin R Kuzma, MD;  Location: [facility];   Service: Orthopedics;  Laterality: Left;  . INCISION / DRAINAGE HAND / FINGER Left 01/31/2013   Social History   Occupational History  . Not on file  Tobacco Use  . Smoking status: Current Every Day Smoker    Packs/day: 1.00    Years: 30.00    Pack years: 30.00    Types: Cigarettes  . Smokeless tobacco: Never Used  Substance and Sexual Activity  . Alcohol use: No  . Drug use: Not Currently    Comment: Methadone maintenance program  . Sexual activity: Not on file

## 2019-07-03 ENCOUNTER — Other Ambulatory Visit: Payer: Self-pay | Admitting: Specialist

## 2019-07-03 NOTE — Telephone Encounter (Signed)
Patient's spouse called for patient's refill of Oxycodone.  Please call patient spouse [name] @ [phone]  Also asking for something to help her sleep as well. All over the counter is not working.

## 2019-07-04 MED ORDER — OXYCODONE-ACETAMINOPHEN 5-325 MG PO TABS: 1.0000 | ORAL_TABLET | Freq: Four times a day (QID) | ORAL | 0 refills | Status: DC | PRN

## 2019-07-04 NOTE — Telephone Encounter (Signed)
Patients son Jessica Vance is calling-- wanting to know if we can give her something to help her sleep. The meds over the counter meds are not working for her.

## 2019-07-07 NOTE — Telephone Encounter (Signed)
I would not mix a sleep medication with this strong a narcotic. Over the counter remedy or benadryl is okay. Her primary care may be willing to prescribe, I will not. jen

## 2019-07-10 ENCOUNTER — Other Ambulatory Visit: Payer: Self-pay | Admitting: Specialist

## 2019-07-10 ENCOUNTER — Telehealth: Payer: Self-pay | Admitting: Specialist

## 2019-07-10 NOTE — Telephone Encounter (Signed)
Patient's son called stating that the two places the patient was referred to can not get her in within the next two weeks.  One place is not until March and the other one is December.  Could the patient be referred to any where else that can get her in at least a week and half from now.  [phone].  Thank you.

## 2019-07-10 NOTE — Telephone Encounter (Signed)
Patient's son called requesting an RX refill on her Oxycodone.  [facility] [address]  [phone].  Thank you.

## 2019-07-11 MED ORDER — OXYCODONE-ACETAMINOPHEN 5-325 MG PO TABS: 1.0000 | ORAL_TABLET | Freq: Four times a day (QID) | ORAL | 0 refills | Status: DC | PRN

## 2019-07-11 NOTE — Telephone Encounter (Signed)
I called and advised Jessica Vance to call and see if they could call him if something opened sooner to get her in.  And advised her meds have been sent in.  He states that he understands

## 2019-07-15 ENCOUNTER — Other Ambulatory Visit: Payer: Self-pay | Admitting: Specialist

## 2019-07-15 MED ORDER — OXYCODONE-ACETAMINOPHEN 5-325 MG PO TABS: 1.0000 | ORAL_TABLET | Freq: Four times a day (QID) | ORAL | 0 refills | Status: DC | PRN

## 2019-07-15 NOTE — Telephone Encounter (Signed)
Patient's son left a message yesterday requesting an RX refill on the patient's Oxycodone.  OZ#[phone].  Thank you.

## 2019-07-15 NOTE — Telephone Encounter (Signed)
Sent request to Dr. Nitka 

## 2019-07-22 ENCOUNTER — Other Ambulatory Visit: Payer: Self-pay | Admitting: Specialist

## 2019-07-22 MED ORDER — OXYCODONE-ACETAMINOPHEN 5-325 MG PO TABS: 1.0000 | ORAL_TABLET | Freq: Four times a day (QID) | ORAL | 0 refills | Status: DC | PRN

## 2019-07-22 NOTE — Telephone Encounter (Signed)
Patient called for refill for Oxycodone. Please call patient to advise  [phone]

## 2019-07-22 NOTE — Telephone Encounter (Signed)
Sent request to Dr. Nitka 

## 2019-07-29 ENCOUNTER — Other Ambulatory Visit: Payer: Self-pay | Admitting: Specialist

## 2019-07-29 MED ORDER — OXYCODONE-ACETAMINOPHEN 5-325 MG PO TABS: 1.0000 | ORAL_TABLET | Freq: Four times a day (QID) | ORAL | 0 refills | Status: DC | PRN

## 2019-07-29 NOTE — Telephone Encounter (Signed)
Sent request to Dr. Nitka 

## 2019-07-29 NOTE — Telephone Encounter (Signed)
Patient's son called requesting an RX refill on her Oxycodone.  [facility] [address]  [phone].  Thank you.

## 2019-08-01 ENCOUNTER — Ambulatory Visit: Payer: BLUE CROSS/BLUE SHIELD | Admitting: Orthopaedic Surgery

## 2019-08-06 ENCOUNTER — Other Ambulatory Visit: Payer: Self-pay | Admitting: Specialist

## 2019-08-06 MED ORDER — OXYCODONE-ACETAMINOPHEN 5-325 MG PO TABS: 1.0000 | ORAL_TABLET | Freq: Four times a day (QID) | ORAL | 0 refills | Status: DC | PRN

## 2019-08-06 NOTE — Telephone Encounter (Signed)
Sent request to Dr. Nitka 

## 2019-08-06 NOTE — Telephone Encounter (Signed)
Pt son [name] called in requesting a refill for his mother on Oxycodone. [facility] [address]   [phone]

## 2019-08-12 ENCOUNTER — Telehealth: Payer: Self-pay | Admitting: Specialist

## 2019-08-12 NOTE — Telephone Encounter (Signed)
Patient's son called. She needs a refill on her oxyCODONE.  Call back number: [phone]

## 2019-08-12 NOTE — Addendum Note (Signed)
Addended by: Minda Ditto, Geoffery Spruce on: 08/12/2019 03:29 PM   Modules accepted: Orders

## 2019-08-18 ENCOUNTER — Telehealth: Payer: Self-pay | Admitting: Specialist

## 2019-08-18 ENCOUNTER — Other Ambulatory Visit: Payer: Self-pay | Admitting: Specialist

## 2019-08-18 NOTE — Telephone Encounter (Signed)
Patient's son called. Says patient has been in pain for days. [facility] His call back number is [phone]. [facility].

## 2019-08-18 NOTE — Telephone Encounter (Signed)
Pt son called back in wanted me to add to the message that, the medication Oxycodone was never received and needs to be called in again as soon as possible shes been out of her medication since 08-14-2019.  Please give him a call to let him know whats going on per pt's request.   [phone]

## 2019-08-18 NOTE — Telephone Encounter (Signed)
Patient called in about mother's oxycodone refill. Patient's son stated medication still has not been called in. Please give son of patient a courtesy call about refill. Patient phone number is [phone].

## 2019-08-18 NOTE — Addendum Note (Signed)
Addended by: Minda Ditto, Alyse Low N on: 08/18/2019 05:09 PM   Modules accepted: Orders

## 2019-08-18 NOTE — Telephone Encounter (Signed)
Duplicate message. 

## 2019-08-19 MED ORDER — OXYCODONE-ACETAMINOPHEN 5-325 MG PO TABS: 1.0000 | ORAL_TABLET | Freq: Four times a day (QID) | ORAL | 0 refills | Status: DC | PRN

## 2019-08-19 NOTE — Telephone Encounter (Signed)
Meds have been sent to pharmacy.  

## 2019-08-25 ENCOUNTER — Other Ambulatory Visit: Payer: Self-pay | Admitting: Specialist

## 2019-08-25 ENCOUNTER — Telehealth: Payer: Self-pay | Admitting: Specialist

## 2019-08-25 MED ORDER — OXYCODONE-ACETAMINOPHEN 5-325 MG PO TABS: 1.0000 | ORAL_TABLET | Freq: Four times a day (QID) | ORAL | 0 refills | Status: DC | PRN

## 2019-08-25 NOTE — Telephone Encounter (Signed)
Patient's son called. Would like a refill on her oxycodone called in. His call back number is [phone]

## 2019-08-25 NOTE — Telephone Encounter (Signed)
Ov note refaxed to [facility] [phone], was initially faxed 06/10/2019

## 2019-09-01 ENCOUNTER — Other Ambulatory Visit: Payer: Self-pay | Admitting: Specialist

## 2019-09-01 MED ORDER — OXYCODONE-ACETAMINOPHEN 5-325 MG PO TABS: 1.0000 | ORAL_TABLET | Freq: Four times a day (QID) | ORAL | 0 refills | Status: DC | PRN

## 2019-09-01 NOTE — Telephone Encounter (Signed)
Meds approved.

## 2019-09-01 NOTE — Telephone Encounter (Signed)
Patient's son Clide Cliff called advised patient need Rx refilled (Oxycodone) The number to contact Clide Cliff is [phone]  Patient's coverage changed to [facility].

## 2019-09-08 ENCOUNTER — Telehealth: Payer: Self-pay | Admitting: Specialist

## 2019-09-08 NOTE — Telephone Encounter (Signed)
Patient's son Jessica Vance called asked if there is another medication his mother can take other than Gabapentin because it causes her hand to tremble pretty bad. Also, Patient Rx refilled (Oxycodone)   The number to contact Jessica Vance is [phone]

## 2019-09-09 NOTE — Telephone Encounter (Signed)
Have Mrs Juul call if she is concerned. Likely narcotics and her many medications give her some side effects, These are not allergic reactions and are not life threatening. More meds to treat the effects of other meds I will not Prescribe.  jen

## 2019-09-10 ENCOUNTER — Other Ambulatory Visit: Payer: Self-pay | Admitting: Specialist

## 2019-09-10 NOTE — Telephone Encounter (Signed)
Patient's son's Clide Cliff called advised patient is almost out of her medication. Ricky asked for a call back as soon as possible. The number to contact Clide Cliff is [phone]

## 2019-09-11 MED ORDER — OXYCODONE-ACETAMINOPHEN 5-325 MG PO TABS: 1.0000 | ORAL_TABLET | Freq: Four times a day (QID) | ORAL | 0 refills | Status: DC | PRN

## 2019-09-16 ENCOUNTER — Other Ambulatory Visit: Payer: Self-pay | Admitting: Specialist

## 2019-09-16 NOTE — Telephone Encounter (Signed)
Patient called and requested a refill of Oxycodone.  Please call patient to advise.  [phone]

## 2019-09-16 NOTE — Telephone Encounter (Signed)
Sent request to Dr. Nitka 

## 2019-09-17 MED ORDER — OXYCODONE-ACETAMINOPHEN 5-325 MG PO TABS: 1.0000 | ORAL_TABLET | Freq: Four times a day (QID) | ORAL | 0 refills | Status: DC | PRN

## 2019-09-18 ENCOUNTER — Ambulatory Visit: Payer: BLUE CROSS/BLUE SHIELD | Admitting: Specialist

## 2019-09-22 ENCOUNTER — Other Ambulatory Visit: Payer: Self-pay | Admitting: Specialist

## 2019-09-22 NOTE — Telephone Encounter (Signed)
Sent request to Dr. Nitka 

## 2019-09-22 NOTE — Telephone Encounter (Signed)
Patient's son Clide Cliff called and requesting refill on Oxycodone on patient behalf. Please send to pharmacy on file. Patient phone number is [phone].

## 2019-09-22 NOTE — Telephone Encounter (Signed)
This is Dr. Nitka's patient. 

## 2019-09-23 MED ORDER — OXYCODONE-ACETAMINOPHEN 5-325 MG PO TABS: 1.0000 | ORAL_TABLET | Freq: Four times a day (QID) | ORAL | 0 refills | Status: DC | PRN

## 2019-09-29 ENCOUNTER — Other Ambulatory Visit: Payer: Self-pay | Admitting: Specialist

## 2019-09-29 NOTE — Telephone Encounter (Signed)
Patient's son Clide Cliff called advised patient  need Rx refilled (Oxycodone) The number to contact Clide Cliff is [phone]

## 2019-09-30 MED ORDER — OXYCODONE-ACETAMINOPHEN 5-325 MG PO TABS: 1.0000 | ORAL_TABLET | Freq: Four times a day (QID) | ORAL | 0 refills | Status: DC | PRN

## 2019-10-06 ENCOUNTER — Other Ambulatory Visit: Payer: Self-pay | Admitting: Specialist

## 2019-10-06 MED ORDER — OXYCODONE-ACETAMINOPHEN 5-325 MG PO TABS: 1.0000 | ORAL_TABLET | Freq: Four times a day (QID) | ORAL | 0 refills | Status: DC | PRN

## 2019-10-06 NOTE — Telephone Encounter (Signed)
Patient's son Clide Cliff called advised his mother need Rx refilled (Oxycodone) The number to contact Clide Cliff is [phone]

## 2019-10-13 ENCOUNTER — Encounter: Payer: Self-pay | Admitting: Specialist

## 2019-10-13 ENCOUNTER — Telehealth: Payer: Self-pay | Admitting: Specialist

## 2019-10-13 ENCOUNTER — Other Ambulatory Visit: Payer: Self-pay

## 2019-10-13 ENCOUNTER — Ambulatory Visit (INDEPENDENT_AMBULATORY_CARE_PROVIDER_SITE_OTHER): Payer: 59 | Admitting: Specialist

## 2019-10-13 ENCOUNTER — Ambulatory Visit: Payer: Self-pay

## 2019-10-13 VITALS — BP 130/86 | HR 78 | Ht 61.0 in | Wt 114.0 lb

## 2019-10-13 DIAGNOSIS — M4807 Spinal stenosis, lumbosacral region: Secondary | ICD-10-CM

## 2019-10-13 DIAGNOSIS — M48062 Spinal stenosis, lumbar region with neurogenic claudication: Secondary | ICD-10-CM | POA: Diagnosis not present

## 2019-10-13 DIAGNOSIS — M4156 Other secondary scoliosis, lumbar region: Secondary | ICD-10-CM

## 2019-10-13 MED ORDER — OXYCODONE-ACETAMINOPHEN 5-325 MG PO TABS: 1.0000 | ORAL_TABLET | Freq: Four times a day (QID) | ORAL | 0 refills | Status: DC | PRN

## 2019-10-13 NOTE — Progress Notes (Signed)
Office Visit Note   Patient: Chantale Leugers           Date of Birth: Feb 14, 1961           MRN: [id number] Visit Date: 10/13/2019              Requested by: No referring provider defined for this encounter. PCP: Patient, No Pcp Per   Assessment & Plan: Visit Diagnoses:  1. Spinal stenosis of lumbar region with neurogenic claudication   2. Other secondary scoliosis, lumbar region     Plan:  Plan:Avoid bending, stooping and avoid lifting weights greater than 10 lbs. Avoid prolong standing and walking. Order for a new walker with wheels. Surgery scheduling secretary Kandice Hams, will call you in the next week to schedule for surgery.  Surgery recommended is a8level lumbar fusion from T10 toS1 this would be done with rods, screws and cages with local bone graft and allograft (donor bone graft). Take hydrocodone for for pain. Risk of surgery includes risk of infection 1 in 200 patients, bleeding 1/2% chance you would need a transfusion. Risk to the nerves is one in 10,000. You will need to use a brace for 3 months and wean from the brace on the 4th month. Expect improved walking and standing tolerance. Expect relief of leg pain but numbness may persist depending on the length and degree of pressure that has been present  Follow-Up Instructions: No follow-ups on file.   Orders:  Orders Placed This Encounter  Procedures  . XR Lumbar Spine 2-3 Views   No orders of the defined types were placed in this encounter.     Procedures: No procedures performed   Clinical Data: No additional findings.   Subjective: Chief Complaint  Patient presents with  . Lower Back - Pain    59 year old female with history of thoracolumbar scoliosis with persistent severe back pain and radiation into the legs. She relates she is unable to be up and out of bed for more than 10 minutes. She was seen in November and was told then that a thoracolumbar fusion was indicated and has been  undergoing a evaluation by her primary care MD and she also is now having an ongoing infection of the right olecranon bursa. She reportedly fell and landed on the right elbow about 4 weeks ago and saw her primary care MD and he placed her on antibiotics. Reports difficulty gettingup and having no strength getting up.  Has right leg pain greater than left. And has trouble getting up and out of bed. She has no problems with bowel or bladder function.    Review of Systems  Constitutional: Positive for activity change, appetite change and unexpected weight change. Negative for chills, diaphoresis, fatigue and fever.  HENT: Negative for congestion, dental problem, drooling, ear discharge, ear pain, facial swelling, hearing loss, mouth sores, nosebleeds, postnasal drip, rhinorrhea, sinus pressure, sinus pain, sneezing, sore throat, tinnitus, trouble swallowing and voice change.   Eyes: Negative.  Negative for photophobia, pain, discharge, redness, itching and visual disturbance.  Respiratory: Negative.  Negative for apnea, cough, choking, chest tightness, shortness of breath, wheezing and stridor.   Cardiovascular: Negative for chest pain, palpitations and leg swelling.  Gastrointestinal: Negative.  Negative for abdominal distention, abdominal pain, anal bleeding, blood in stool, constipation, diarrhea, nausea and rectal pain.  Endocrine: Positive for cold intolerance. Negative for heat intolerance, polydipsia, polyphagia and polyuria.  Genitourinary: Positive for hematuria. Negative for difficulty urinating, dyspareunia, dysuria, enuresis, flank pain,  frequency and genital sores.  Musculoskeletal: Positive for back pain and joint swelling. Negative for arthralgias, gait problem, myalgias, neck pain and neck stiffness.  Skin: Negative.   Allergic/Immunologic: Negative for environmental allergies, food allergies and immunocompromised state.  Neurological: Positive for weakness and numbness. Negative for  dizziness, tremors, seizures, syncope, facial asymmetry, speech difficulty, light-headedness and headaches.  Hematological: Negative.  Negative for adenopathy. Does not bruise/bleed easily.  Psychiatric/Behavioral: Negative for behavioral problems, confusion, decreased concentration, dysphoric mood, hallucinations, self-injury, sleep disturbance and suicidal ideas. The patient is not nervous/anxious and is not hyperactive.      Objective: Vital Signs: There were no vitals taken for this visit.  Physical Exam Constitutional:      Appearance: She is well-developed.  HENT:     Head: Normocephalic and atraumatic.  Eyes:     Pupils: Pupils are equal, round, and reactive to light.  Pulmonary:     Effort: Pulmonary effort is normal.     Breath sounds: Normal breath sounds.  Abdominal:     General: Bowel sounds are normal.     Palpations: Abdomen is soft.  Musculoskeletal:        General: Normal range of motion.     Cervical back: Normal range of motion and neck supple.  Skin:    General: Skin is warm and dry.  Neurological:     Mental Status: She is alert and oriented to person, place, and time.  Psychiatric:        Behavior: Behavior normal.        Thought Content: Thought content normal.        Judgment: Judgment normal.     Ortho Exam  Specialty Comments:  No specialty comments available.  Imaging: No results found.   PMFS History: Patient Active Problem List   Diagnosis Date Noted  . Scoliosis of lumbar spine 01/26/2019  . Lumbar facet arthropathy 01/26/2019  . Other intervertebral disc degeneration, lumbar region 01/26/2019  . CHRONIC OSTEOMYELITIS ANKLE AND FOOT 07/06/2010  . DEPRESSION 10/30/2008  . ABSCESS, BACK 10/30/2008  . PNEUMONIA, HX OF 10/30/2008   Past Medical History:  Diagnosis Date  . Animal bite 01/30/2013   LEFT HAND  . Arthritis     Family History  Family history unknown: Yes    Past Surgical History:  Procedure Laterality Date  . HAND  SURGERY    . I & D EXTREMITY Left 01/31/2013   Procedure: IRRIGATION AND DEBRIDEMENT EXTREMITY;  Surgeon: Tami Ribas, MD;  Location: [facility] OR;  Service: Orthopedics;  Laterality: Left;  . INCISION / DRAINAGE HAND / FINGER Left 01/31/2013   Social History   Occupational History  . Not on file  Tobacco Use  . Smoking status: Current Every Day Smoker    Packs/day: 1.00    Years: 30.00    Pack years: 30.00    Types: Cigarettes  . Smokeless tobacco: Never Used  Substance and Sexual Activity  . Alcohol use: No  . Drug use: Not Currently    Comment: Methadone maintenance program  . Sexual activity: Not on file

## 2019-10-13 NOTE — Patient Instructions (Signed)
Plan:Avoid bending, stooping and avoid lifting weights greater than 10 lbs. Avoid prolong standing and walking. Order for a new walker with wheels. Surgery scheduling secretary Tivis Ringer, will call you in the next week to schedule for surgery.  Surgery recommended is a8level lumbar fusion from T10 toS1 this would be done with rods, screws and cages with local bone graft and allograft (donor bone graft). Take hydrocodone for for pain. Risk of surgery includes risk of infection 1 in 200 patients, bleeding 1/2% chance you would need a transfusion. Risk to the nerves is one in 10,000. You will need to use a brace for 3 months and wean from the brace on the 4th month. Expect improved walking and standing tolerance. Expect relief of leg pain but numbness may persist depending on the length and degree of pressure that has been present

## 2019-10-13 NOTE — Telephone Encounter (Signed)
Patient requesting refill of Oxycodone medication. Pease send to pharmacy on file. Patient phone number is [phone]

## 2019-10-20 ENCOUNTER — Other Ambulatory Visit: Payer: Self-pay | Admitting: Specialist

## 2019-10-20 NOTE — Telephone Encounter (Signed)
Patient's son called.   They are requesting a refill on her oxycodone.   Call back: [phone]

## 2019-10-21 MED ORDER — OXYCODONE-ACETAMINOPHEN 5-325 MG PO TABS: 1.0000 | ORAL_TABLET | Freq: Four times a day (QID) | ORAL | 0 refills | Status: DC | PRN

## 2019-10-21 NOTE — Telephone Encounter (Signed)
Meds have been sent in  

## 2019-10-21 NOTE — Addendum Note (Signed)
Addended by: Penne Lash, Otis Dials on: 10/21/2019 08:48 AM   Modules accepted: Orders

## 2019-10-27 ENCOUNTER — Other Ambulatory Visit: Payer: Self-pay | Admitting: Specialist

## 2019-10-27 MED ORDER — OXYCODONE-ACETAMINOPHEN 5-325 MG PO TABS: 1.0000 | ORAL_TABLET | Freq: Four times a day (QID) | ORAL | 0 refills | Status: DC | PRN

## 2019-10-27 NOTE — Telephone Encounter (Signed)
Patient's son called. She would like a rx hydrocodone called in.

## 2019-11-03 ENCOUNTER — Other Ambulatory Visit: Payer: Self-pay | Admitting: Specialist

## 2019-11-03 MED ORDER — OXYCODONE-ACETAMINOPHEN 5-325 MG PO TABS: 1.0000 | ORAL_TABLET | Freq: Four times a day (QID) | ORAL | 0 refills | Status: DC | PRN

## 2019-11-03 NOTE — Telephone Encounter (Signed)
Patient called for a refill of Oxycodone  Please call patient @ [phone]

## 2019-11-03 NOTE — Telephone Encounter (Signed)
Sent the request to Dr. Nitka 

## 2019-11-11 ENCOUNTER — Other Ambulatory Visit: Payer: Self-pay | Admitting: Specialist

## 2019-11-11 NOTE — Telephone Encounter (Signed)
Patient called to request Oxycodone refill.  Please call patient to advise. [phone]

## 2019-11-12 MED ORDER — OXYCODONE-ACETAMINOPHEN 5-325 MG PO TABS: 1.0000 | ORAL_TABLET | Freq: Four times a day (QID) | ORAL | 0 refills | Status: DC | PRN

## 2019-11-18 ENCOUNTER — Encounter: Payer: Self-pay | Admitting: Family Medicine

## 2019-11-18 ENCOUNTER — Other Ambulatory Visit: Payer: Self-pay

## 2019-11-18 ENCOUNTER — Ambulatory Visit (INDEPENDENT_AMBULATORY_CARE_PROVIDER_SITE_OTHER): Payer: 59 | Admitting: Family Medicine

## 2019-11-18 DIAGNOSIS — M25511 Pain in right shoulder: Secondary | ICD-10-CM | POA: Diagnosis not present

## 2019-11-18 DIAGNOSIS — G8929 Other chronic pain: Secondary | ICD-10-CM | POA: Diagnosis not present

## 2019-11-18 NOTE — Progress Notes (Signed)
Jessica Vance - 59 y.o. female MRN [id number]  Date of birth: August 19, 1961  Office Visit Note: Visit Date: 11/18/2019 PCP: Lilian Coma., MD Referred by: Lilian Coma., MD  Subjective: Chief Complaint  Patient presents with  . Right Shoulder - Pain    Pain in shoulder since fall from bed in January this year. Decreased ROM due to pain. Her PCP (Dr. Valora Piccolo) referred her here for a cortisone injection   HPI: Jessica Vance is a 59 y.o. female who comes in today with right shoulder pain for the past several months.  She fell off her bed in January. At the time, she had significant injury to elbow which also became infected so that was the primary focus. After that healed, she realized that she could not move her right shoulder as well as her left one and she had pain with most movements. She saw her PCP who referred her here for steroid injection.  She has known spinal stenosis- scheduled for an 8 level lumbar fusion from T10 to S1 with Dr. Louanne Skye.   ROS Otherwise per HPI.  Assessment & Plan: Visit Diagnoses:  1. Chronic right shoulder pain     Plan: Right shoulder pain since fall in January with findings of rotator cuff impingement and early adhesive capsulitis. Patient elected for subacromial corticosteroid injection today for pain relief. Provided rotator cuff rehabilitation exercises. If pain does not improve, will obtain x-rays of shoulder and could also try glenohumeral injection.     Follow-up: PRN  Procedures: No procedures performed  No notes on file   Clinical History: No specialty comments available.   She reports that she has been smoking cigarettes. She has a 30.00 pack-year smoking history. She has never used smokeless tobacco. No results for input(s): HGBA1C, LABURIC in the last 8760 hours.  Objective:  VS:  HT:    WT:   BMI:     BP:   HR: bpm  TEMP: ( )  RESP:  Physical Exam  PHYSICAL EXAM: Gen: NAD, alert, cooperative with exam, appears older than stated  age 82: clear conjunctiva,  CV:  no edema, capillary refill brisk, normal rate Resp: non-labored Skin: no rashes, normal turgor  Neuro: no gross deficits.  Psych:  alert and oriented  Ortho Exam  Right Shoulder: Inspection reveals no obvious deformity, atrophy, or asymmetry. No bruising. No swelling TTP over posterior and lateral acromion ROM reduced to 80-90 degrees flexion, abduction, 45 degrees ER. IR severely limited NV intact distally Normal scapular function observed. Special Tests:  - Impingement: positive Hawkins, unable to get shoulder in flexion enough for neers testing - Supraspinatous:5/5 strength with resisted flexion at 20 degrees- painful - Infraspinatous/Teres Minor: 5/5 strength with ER - Subscapularis: 5/5 strength with IR - Biceps tendon: Negative Speeds - Labrum: Negative Obriens - AC Joint: Negative cross arm  Imaging: No results found.  Past Medical/Family/Surgical/Social History: Medications & Allergies reviewed per EMR, new medications updated. Patient Active Problem List   Diagnosis Date Noted  . Scoliosis of lumbar spine 01/26/2019  . Lumbar facet arthropathy 01/26/2019  . Other intervertebral disc degeneration, lumbar region 01/26/2019  . CHRONIC OSTEOMYELITIS ANKLE AND FOOT 07/06/2010  . DEPRESSION 10/30/2008  . ABSCESS, BACK 10/30/2008  . PNEUMONIA, HX OF 10/30/2008   Past Medical History:  Diagnosis Date  . Animal bite 01/30/2013   LEFT HAND  . Arthritis    Family History  Family history unknown: Yes   Past Surgical History:  Procedure Laterality Date  .  HAND SURGERY    . I & D EXTREMITY Left 01/31/2013   Procedure: IRRIGATION AND DEBRIDEMENT EXTREMITY;  Surgeon: Tami Ribas, MD;  Location: [facility] OR;  Service: Orthopedics;  Laterality: Left;  . INCISION / DRAINAGE HAND / FINGER Left 01/31/2013   Social History   Occupational History  . Not on file  Tobacco Use  . Smoking status: Current Every Day Smoker    Packs/day: 1.00     Years: 30.00    Pack years: 30.00    Types: Cigarettes  . Smokeless tobacco: Never Used  Substance and Sexual Activity  . Alcohol use: No  . Drug use: Not Currently    Comment: Methadone maintenance program  . Sexual activity: Not on file

## 2019-11-18 NOTE — Progress Notes (Signed)
I saw and examined the patient with Dr. Robby Sermon and agree with assessment and plan as outlined.    Right shoulder pain since falling in January.  Exam suggests rotator cuff impingement.  Will inject the subacromial space today.  Imaging studies if symptoms persist.

## 2019-11-19 ENCOUNTER — Other Ambulatory Visit: Payer: Self-pay | Admitting: Specialist

## 2019-11-19 NOTE — Telephone Encounter (Signed)
Patient's son Clide Cliff called requesting mother refill of oxycodone. Please send to pharmacy on file. Patient phone number is [phone].

## 2019-11-20 ENCOUNTER — Other Ambulatory Visit: Payer: Self-pay | Admitting: Specialist

## 2019-11-20 NOTE — Telephone Encounter (Signed)
Pts son called in wanting to make sure the refill would be called in for the pt. Pt would like a call back when this is done.  [phone]

## 2019-11-24 MED ORDER — OXYCODONE-ACETAMINOPHEN 5-325 MG PO TABS: 1.0000 | ORAL_TABLET | Freq: Four times a day (QID) | ORAL | 0 refills | Status: DC | PRN

## 2019-11-24 NOTE — Telephone Encounter (Signed)
Sent new request to Dr. Otelia Sergeant today

## 2019-11-27 ENCOUNTER — Other Ambulatory Visit: Payer: Self-pay | Admitting: Specialist

## 2019-11-27 NOTE — Telephone Encounter (Signed)
Patient's son Clide Cliff called advised patient need Rx refilled (Oxycodone) Patient's son asked if patient can be excused from her court date 12/30/2019 due to her condition.  The number to contact Clide Cliff is [phone]

## 2019-11-28 NOTE — Telephone Encounter (Signed)
Patient's son asked if the patient can get a letter to be excused from her court date 12/30/2019 due to her condition.  The number to contact Clide Cliff is [phone]---Please advise

## 2019-12-02 ENCOUNTER — Telehealth: Payer: Self-pay | Admitting: Specialist

## 2019-12-02 ENCOUNTER — Other Ambulatory Visit: Payer: Self-pay | Admitting: Specialist

## 2019-12-02 MED ORDER — OXYCODONE-ACETAMINOPHEN 7.5-325 MG PO TABS: 1.0000 | ORAL_TABLET | Freq: Four times a day (QID) | ORAL | 0 refills | Status: DC | PRN

## 2019-12-02 NOTE — Telephone Encounter (Signed)
Patient's son called.  He is requesting that the patient's oxyCODONE be refilled.   Call back: [phone]

## 2019-12-08 NOTE — Telephone Encounter (Signed)
She can but we need a copy of the court letter of the number associated with it. jen

## 2019-12-08 NOTE — Telephone Encounter (Signed)
I called and lmom for Jessica Vance that we would need to a copy of a letter to confirm the court date.

## 2019-12-09 ENCOUNTER — Other Ambulatory Visit: Payer: Self-pay | Admitting: Specialist

## 2019-12-09 NOTE — Telephone Encounter (Signed)
Patient's son Ricky called advised patient  need Rx refilled (Oxycodone) The number to contact Ricky is [phone]  °

## 2019-12-09 NOTE — Telephone Encounter (Signed)
Sent request to Dr. Nitka 

## 2019-12-10 MED ORDER — OXYCODONE-ACETAMINOPHEN 7.5-325 MG PO TABS: 1.0000 | ORAL_TABLET | Freq: Four times a day (QID) | ORAL | 0 refills | Status: DC | PRN

## 2019-12-17 ENCOUNTER — Other Ambulatory Visit: Payer: Self-pay | Admitting: Specialist

## 2019-12-17 MED ORDER — OXYCODONE-ACETAMINOPHEN 7.5-325 MG PO TABS: 1.0000 | ORAL_TABLET | Freq: Four times a day (QID) | ORAL | 0 refills | Status: DC | PRN

## 2019-12-17 NOTE — Telephone Encounter (Signed)
Sent request to Dr. Nitka 

## 2019-12-17 NOTE — Telephone Encounter (Signed)
Patient's son called requesting an RX refill for her Oxycodone.  Patient has enough medication for today.  Patient uses [facility] on [address].  [phone].  Thank you

## 2019-12-23 ENCOUNTER — Other Ambulatory Visit: Payer: Self-pay | Admitting: Specialist

## 2019-12-23 NOTE — Telephone Encounter (Signed)
Patient's son Clide Cliff called advised patient need Rx refilled Oxycodone. The number to contact Clide Cliff is [phone]

## 2019-12-24 MED ORDER — OXYCODONE-ACETAMINOPHEN 7.5-325 MG PO TABS: 1.0000 | ORAL_TABLET | Freq: Four times a day (QID) | ORAL | 0 refills | Status: DC | PRN

## 2019-12-24 NOTE — Telephone Encounter (Signed)
Sent request to Dr. Nitka 

## 2019-12-30 ENCOUNTER — Other Ambulatory Visit: Payer: Self-pay | Admitting: Specialist

## 2019-12-30 NOTE — Telephone Encounter (Signed)
Pt called requesting a refill of her oxycodone prescription.

## 2019-12-30 NOTE — Telephone Encounter (Signed)
Sent to Dr. Nitka ° ° °

## 2019-12-31 MED ORDER — OXYCODONE-ACETAMINOPHEN 7.5-325 MG PO TABS: 1.0000 | ORAL_TABLET | Freq: Four times a day (QID) | ORAL | 0 refills | Status: DC | PRN

## 2020-01-05 ENCOUNTER — Other Ambulatory Visit: Payer: Self-pay | Admitting: Specialist

## 2020-01-05 MED ORDER — OXYCODONE-ACETAMINOPHEN 7.5-325 MG PO TABS: 1.0000 | ORAL_TABLET | Freq: Four times a day (QID) | ORAL | 0 refills | Status: DC | PRN

## 2020-01-05 NOTE — Telephone Encounter (Signed)
Pts son Jessica Vance called in on behalf of the pt asking for a refill of the oxycodone medication.   [phone]

## 2020-01-05 NOTE — Telephone Encounter (Signed)
Sent request to Dr. Nitka 

## 2020-01-12 ENCOUNTER — Other Ambulatory Visit: Payer: Self-pay | Admitting: Specialist

## 2020-01-12 MED ORDER — OXYCODONE-ACETAMINOPHEN 7.5-325 MG PO TABS: 1.0000 | ORAL_TABLET | Freq: Four times a day (QID) | ORAL | 0 refills | Status: AC | PRN

## 2020-01-12 NOTE — Telephone Encounter (Signed)
I called and lmom for Jessica Vance that per Dr. Otelia Sergeant she needs to go to court and take care of her charges of the DWI, that we would not be able to write the note that he requested.  I advised that the request for pain meds have been sent to Dr. Otelia Sergeant and is pending his review.

## 2020-01-12 NOTE — Telephone Encounter (Signed)
Patient's son Clide Cliff called stating his mother need Rx refilled Oxycodone. Clide Cliff also asked if his mother could get a letter excusing her from going to court January 21 2020. Clide Cliff also asked if a note could be written excusing his mother from going to anything except doctor's appointments. The number to contact Clide Cliff is [phone]

## 2020-01-13 ENCOUNTER — Telehealth: Payer: Self-pay | Admitting: *Deleted

## 2020-01-13 NOTE — Telephone Encounter (Signed)
Received call from ricky Meixner stating pt has upcoming appt for CT scan at [facility] imaging and needs an extension from her insurance, I filled out the form with [employer] requesting an extension.

## 2020-01-14 ENCOUNTER — Telehealth: Payer: Self-pay | Admitting: Specialist

## 2020-01-14 NOTE — Telephone Encounter (Signed)
Patient called.   Requesting a refill on Oxycodone. Also requesting a call back if there is a reason why it cant be refilled.   Call back: [phone]

## 2020-01-14 NOTE — Telephone Encounter (Signed)
Pt has been approved by [employer] for CT chest and Ct Lumbar with auth # [id number], valid 01/26/20-04/25/20, son Clide Cliff is aware of approval

## 2020-01-15 NOTE — Telephone Encounter (Signed)
I called and spoke with [facility] yesterday and advised him that the medication was sent to the pharmacy on Monday 5/24 @ 5:13pm--we rec'd confirmation they rec'd it.  He states that he had not rec'd a message from the pharmacy that it was ready. I advised him that he can not rely on them to contact him once it is ready that he should call them to find out if it has been rec'd and filled. He states that he understands and he will call them.

## 2020-01-22 ENCOUNTER — Other Ambulatory Visit: Payer: Self-pay | Admitting: Specialist

## 2020-01-22 NOTE — Telephone Encounter (Signed)
Patient's son called.  They are requesting a refill on the patient's oxycodone. Says she is on her last ones and needs it today.  Call back: [phone]

## 2020-01-22 NOTE — Addendum Note (Signed)
Addended by: Penne Lash, Neysa Bonito N on: 01/22/2020 02:19 PM   Modules accepted: Orders

## 2020-01-22 NOTE — Telephone Encounter (Signed)
Sent request to Dr. Nitka 

## 2020-01-23 MED ORDER — OXYCODONE-ACETAMINOPHEN 7.5-325 MG PO TABS: 1.0000 | ORAL_TABLET | Freq: Four times a day (QID) | ORAL | 0 refills | Status: DC | PRN

## 2020-01-26 ENCOUNTER — Other Ambulatory Visit: Payer: Self-pay

## 2020-01-26 ENCOUNTER — Ambulatory Visit
Admission: RE | Admit: 2020-01-26 | Discharge: 2020-01-26 | Disposition: A | Discharge status: Home / Self Care | Payer: 59 | Source: Ambulatory Visit | Attending: Specialist | Admitting: Specialist

## 2020-01-26 DIAGNOSIS — M4807 Spinal stenosis, lumbosacral region: Secondary | ICD-10-CM

## 2020-01-26 DIAGNOSIS — M48062 Spinal stenosis, lumbar region with neurogenic claudication: Secondary | ICD-10-CM

## 2020-01-26 DIAGNOSIS — M4156 Other secondary scoliosis, lumbar region: Secondary | ICD-10-CM

## 2020-01-27 ENCOUNTER — Other Ambulatory Visit: Payer: Self-pay | Admitting: Specialist

## 2020-01-27 MED ORDER — OXYCODONE-ACETAMINOPHEN 7.5-325 MG PO TABS: 1.0000 | ORAL_TABLET | Freq: Four times a day (QID) | ORAL | 0 refills | Status: DC | PRN

## 2020-01-27 NOTE — Telephone Encounter (Signed)
Pts son Clide Cliff called requesting a refill on the pts oxycodone; Clide Cliff would like a call when this has been sent in so he knows when to pick it up.   [phone]

## 2020-02-03 ENCOUNTER — Other Ambulatory Visit: Payer: Self-pay | Admitting: Specialist

## 2020-02-03 MED ORDER — OXYCODONE-ACETAMINOPHEN 7.5-325 MG PO TABS: 1.0000 | ORAL_TABLET | Freq: Four times a day (QID) | ORAL | 0 refills | Status: AC | PRN

## 2020-02-03 NOTE — Telephone Encounter (Signed)
Sent request to Dr. Nitka 

## 2020-02-03 NOTE — Telephone Encounter (Signed)
Pt is requesting a refill of her oxycodone.  [phone]

## 2020-02-04 ENCOUNTER — Ambulatory Visit (INDEPENDENT_AMBULATORY_CARE_PROVIDER_SITE_OTHER): Payer: 59 | Admitting: Specialist

## 2020-02-04 ENCOUNTER — Ambulatory Visit: Payer: Self-pay

## 2020-02-04 ENCOUNTER — Encounter: Payer: Self-pay | Admitting: Specialist

## 2020-02-04 ENCOUNTER — Ambulatory Visit (INDEPENDENT_AMBULATORY_CARE_PROVIDER_SITE_OTHER): Payer: 59

## 2020-02-04 ENCOUNTER — Other Ambulatory Visit: Payer: Self-pay

## 2020-02-04 VITALS — BP 121/78 | HR 80 | Ht 61.0 in | Wt 115.0 lb

## 2020-02-04 DIAGNOSIS — M48062 Spinal stenosis, lumbar region with neurogenic claudication: Secondary | ICD-10-CM

## 2020-02-04 DIAGNOSIS — M7021 Olecranon bursitis, right elbow: Secondary | ICD-10-CM

## 2020-02-04 DIAGNOSIS — M79661 Pain in right lower leg: Secondary | ICD-10-CM

## 2020-02-04 DIAGNOSIS — M4316 Spondylolisthesis, lumbar region: Secondary | ICD-10-CM | POA: Diagnosis not present

## 2020-02-04 DIAGNOSIS — M4156 Other secondary scoliosis, lumbar region: Secondary | ICD-10-CM

## 2020-02-04 NOTE — Patient Instructions (Addendum)
Avoid bending, stooping and avoid lifting weights greater than 10 lbs. Avoid prolong standing and walking. Avoid frequent bending and stooping  No lifting greater than 10 lbs. May use ice or moist heat for pain. Weight loss is of benefit. Handicap license is approved. Right elbow pad to prevent trauma to this area.  Repeat MRI of the lumbar spine as it has been nearly 11 months since last myelogram,pre op for thoracolumbar fusion surgery T10 to S1. Continue meds for pain control until surgery can be performed. WIll start po antibiotics for infected right olecranon bursitis, Obtain MRI and plan for surgery in next 2-4 weeks.

## 2020-02-04 NOTE — Progress Notes (Signed)
Office Visit Note   Patient: Jessica Vance           Date of Birth: 03-16-1961           MRN: [id number] Visit Date: 02/04/2020              Requested by: Malka So., MD No address on file PCP: Malka So., MD   Assessment & Plan: Visit Diagnoses:  1. Olecranon bursitis, right elbow   2. Pain in right lower leg   3. Spondylolisthesis, lumbar region   4. Spinal stenosis of lumbar region with neurogenic claudication   5. Other secondary scoliosis, lumbar region     Plan: Avoid bending, stooping and avoid lifting weights greater than 10 lbs. Avoid prolong standing and walking. Avoid frequent bending and stooping  No lifting greater than 10 lbs. May use ice or moist heat for pain. Weight loss is of benefit. Handicap license is approved. Right elbow pad to prevent trauma to this area.  Repeat MRI of the lumbar spine as it has been nearly 11 months since last myelogram,pre op for thoracolumbar fusion surgery T10 to S1. Continue meds for pain control until surgery can be performed. WIll start po antibiotics for infected right olecranon bursitis, Obtain MRI and plan for surgery in next 2-4 weeks.   Follow-Up Instructions: Return in about 4 weeks (around 03/03/2020).   Orders:  Orders Placed This Encounter  Procedures  . XR Tibia/Fibula Right  . XR Elbow 2 Views Right   No orders of the defined types were placed in this encounter.     Procedures: HM DEXA SCAN  Date/Time: 02/04/2020 11:53 AM Performed by: Kerrin Champagne, MD Authorized by: Kerrin Champagne, MD  Local anesthesia used: no  Anesthesia: Local anesthesia used: no  Sedation: Patient sedated: no       Clinical Data: No additional findings.   Subjective: Chief Complaint  Patient presents with  . Lower Back - Pain  . Right Elbow - Open Wound, Follow-up    HPI  Review of Systems   Objective: Vital Signs: BP 121/78 (BP Location: Left Arm, Patient Position: Sitting)   Pulse 80   Ht  5\' 1"  (1.549 m)   Wt 115 lb (52.2 kg)   BMI 21.73 kg/m   Physical Exam  Ortho Exam  Specialty Comments:  No specialty comments available.  Imaging: No results found.   PMFS History: Patient Active Problem List   Diagnosis Date Noted  . Scoliosis of lumbar spine 01/26/2019  . Lumbar facet arthropathy 01/26/2019  . Other intervertebral disc degeneration, lumbar region 01/26/2019  . CHRONIC OSTEOMYELITIS ANKLE AND FOOT 07/06/2010  . DEPRESSION 10/30/2008  . ABSCESS, BACK 10/30/2008  . PNEUMONIA, HX OF 10/30/2008   Past Medical History:  Diagnosis Date  . Animal bite 01/30/2013   LEFT HAND  . Arthritis     Family History  Family history unknown: Yes    Past Surgical History:  Procedure Laterality Date  . HAND SURGERY    . I & D EXTREMITY Left 01/31/2013   Procedure: IRRIGATION AND DEBRIDEMENT EXTREMITY;  Surgeon: 02/02/2013, MD;  Location: [facility] OR;  Service: Orthopedics;  Laterality: Left;  . INCISION / DRAINAGE HAND / FINGER Left 01/31/2013   Social History   Occupational History  . Not on file  Tobacco Use  . Smoking status: Current Every Day Smoker    Packs/day: 1.00    Years: 30.00    Pack years: 30.00  Types: Cigarettes  . Smokeless tobacco: Never Used  Vaping Use  . Vaping Use: Never used  Substance and Sexual Activity  . Alcohol use: No  . Drug use: Not Currently    Comment: Methadone maintenance program  . Sexual activity: Not on file

## 2020-02-06 ENCOUNTER — Telehealth: Payer: Self-pay | Admitting: Specialist

## 2020-02-06 NOTE — Telephone Encounter (Signed)
Patient's son called stating that Dr. Otelia Sergeant was going to send in a RX for an Antibiotic.  The son stated that it has not been received by the Pharmacy.  ON#[phone].  Thank you.

## 2020-02-09 ENCOUNTER — Other Ambulatory Visit: Payer: Self-pay | Admitting: Radiology

## 2020-02-09 MED ORDER — DOXYCYCLINE HYCLATE 100 MG PO CAPS: 100.0000 mg | ORAL_CAPSULE | Freq: Two times a day (BID) | ORAL | 1 refills | Status: AC

## 2020-02-09 NOTE — Telephone Encounter (Signed)
I called and advised Jessica Vance that her medication was sent in to her pharmacy

## 2020-02-09 NOTE — Telephone Encounter (Signed)
Son called again. Repeat message.

## 2020-02-11 ENCOUNTER — Other Ambulatory Visit: Payer: Self-pay | Admitting: Radiology

## 2020-02-11 ENCOUNTER — Other Ambulatory Visit: Payer: Self-pay | Admitting: Specialist

## 2020-02-11 DIAGNOSIS — M4807 Spinal stenosis, lumbosacral region: Secondary | ICD-10-CM

## 2020-02-11 MED ORDER — OXYCODONE-ACETAMINOPHEN 7.5-325 MG PO TABS: 1.0000 | ORAL_TABLET | Freq: Four times a day (QID) | ORAL | 0 refills | Status: DC | PRN

## 2020-02-11 NOTE — Telephone Encounter (Signed)
Patient's son called.  He is requesting a refill on the patient's pain medicine. Also want to know the status of her MRI

## 2020-02-11 NOTE — Addendum Note (Signed)
Addended by: Penne Lash, Neysa Bonito N on: 02/11/2020 11:09 AM   Modules accepted: Orders

## 2020-02-18 ENCOUNTER — Other Ambulatory Visit: Payer: Self-pay | Admitting: Specialist

## 2020-02-18 MED ORDER — OXYCODONE-ACETAMINOPHEN 7.5-325 MG PO TABS: 1.0000 | ORAL_TABLET | Freq: Four times a day (QID) | ORAL | 0 refills | Status: DC | PRN

## 2020-02-18 NOTE — Telephone Encounter (Signed)
Pt would like a refill on her oxycodone medicine and wanted to make Dr.Nitka aware that her MRI has been scheduled.

## 2020-02-18 NOTE — Telephone Encounter (Signed)
Request sent to Dr. Nitka  

## 2020-02-25 ENCOUNTER — Telehealth: Payer: Self-pay | Admitting: *Deleted

## 2020-02-25 ENCOUNTER — Other Ambulatory Visit: Payer: Self-pay | Admitting: Specialist

## 2020-02-25 NOTE — Telephone Encounter (Signed)
-----   Message from Mauri Reading sent at 02/25/2020  8:22 AM EDT ----- Peri Jefferson morning Martie Lee, Called this pt to reschedule post from 7/21 to MRI review with Nitka. Pt stated she is not ready to reschedule. Pt stated she will call the beginning of Aug to reschedule. I stated to pt that I can go ahead and take of rescheduling for her and she declined.  Have a great day.  Crystal

## 2020-02-25 NOTE — Telephone Encounter (Signed)
Sent request to Dr. Nitka 

## 2020-02-25 NOTE — Telephone Encounter (Signed)
Patient's son Clide Cliff called needing Rx refilled for his mom (Oxycodone) The number to contact Clide Cliff is [phone]

## 2020-02-26 MED ORDER — OXYCODONE-ACETAMINOPHEN 7.5-325 MG PO TABS: 1.0000 | ORAL_TABLET | Freq: Four times a day (QID) | ORAL | 0 refills | Status: DC | PRN

## 2020-03-05 ENCOUNTER — Telehealth: Payer: Self-pay | Admitting: Specialist

## 2020-03-05 NOTE — Telephone Encounter (Signed)
Pls advise. Thanks.  

## 2020-03-05 NOTE — Telephone Encounter (Signed)
Patient son Jessica Vance called advised he called Tuesday morning for a refill for his mother (Oxycodone) There is not a message showing in the system for 03/02/2020.  Patient asked if the Rx can can be called in today.Patient is out of her medication. The number to contact Jessica Vance is [phone]

## 2020-03-05 NOTE — Telephone Encounter (Signed)
Advise son that narcotics need to be filled by Dr. Otelia Sergeant.  I asked our MRI scheduler to have lumbar MRI done sooner than August and she is currently working on that.  I just saw a note in the chart stating that they had tried to get her in sooner but she declined.  I recommend getting scan done as soon as possible.

## 2020-03-05 NOTE — Telephone Encounter (Signed)
Sent to Dr Otelia Sergeant as well

## 2020-03-09 NOTE — Telephone Encounter (Signed)
Tried calling. No answer.

## 2020-03-10 ENCOUNTER — Telehealth: Payer: Self-pay | Admitting: Specialist

## 2020-03-10 ENCOUNTER — Ambulatory Visit: Payer: 59 | Admitting: Specialist

## 2020-03-10 NOTE — Telephone Encounter (Signed)
Please advise 

## 2020-03-10 NOTE — Telephone Encounter (Signed)
Patient called requesting a refill of oxycodone. Please send to pharmacy on file. Patient's phone number is [phone].

## 2020-03-11 ENCOUNTER — Other Ambulatory Visit: Payer: Self-pay | Admitting: Specialist

## 2020-03-15 ENCOUNTER — Other Ambulatory Visit: Payer: Self-pay | Admitting: Specialist

## 2020-03-15 ENCOUNTER — Telehealth: Payer: Self-pay | Admitting: Specialist

## 2020-03-15 MED ORDER — OXYCODONE-ACETAMINOPHEN 7.5-325 MG PO TABS: 1.0000 | ORAL_TABLET | Freq: Four times a day (QID) | ORAL | 0 refills | Status: DC | PRN

## 2020-03-15 NOTE — Telephone Encounter (Signed)
Patient's son called asked for a call back concerning the pain medication for his mom. The number to contact Clide Cliff is [phone]

## 2020-03-15 NOTE — Telephone Encounter (Signed)
Rx sent to her pharmacy, hopefully with surgery we can start to wean.

## 2020-03-15 NOTE — Telephone Encounter (Signed)
Could you please advise on pain medication?

## 2020-03-24 ENCOUNTER — Other Ambulatory Visit: Payer: Self-pay | Admitting: Specialist

## 2020-03-24 NOTE — Telephone Encounter (Signed)
Sent request to Dr. Nitka 

## 2020-03-24 NOTE — Telephone Encounter (Signed)
Pt called stating she would like a refill of her oxycodone called in and a CB so she knows when she can have it picked up.  [phone]

## 2020-03-26 ENCOUNTER — Other Ambulatory Visit: Payer: Self-pay

## 2020-03-26 ENCOUNTER — Ambulatory Visit
Admission: RE | Admit: 2020-03-26 | Discharge: 2020-03-26 | Disposition: A | Discharge status: Home / Self Care | Payer: 59 | Source: Ambulatory Visit | Attending: Specialist | Admitting: Specialist

## 2020-03-26 DIAGNOSIS — M4807 Spinal stenosis, lumbosacral region: Secondary | ICD-10-CM

## 2020-03-29 MED ORDER — OXYCODONE-ACETAMINOPHEN 7.5-325 MG PO TABS: 1.0000 | ORAL_TABLET | Freq: Four times a day (QID) | ORAL | 0 refills | Status: DC | PRN

## 2020-04-09 ENCOUNTER — Telehealth: Payer: Self-pay | Admitting: Specialist

## 2020-04-09 NOTE — Telephone Encounter (Signed)
Patient's son call. She would like a refill on oxycodone.

## 2020-04-09 NOTE — Telephone Encounter (Signed)
Please advise 

## 2020-04-10 ENCOUNTER — Other Ambulatory Visit: Payer: Self-pay | Admitting: Specialist

## 2020-04-10 MED ORDER — OXYCODONE-ACETAMINOPHEN 7.5-325 MG PO TABS: 1.0000 | ORAL_TABLET | Freq: Four times a day (QID) | ORAL | 0 refills | Status: DC | PRN

## 2020-04-20 ENCOUNTER — Other Ambulatory Visit: Payer: Self-pay

## 2020-04-20 NOTE — Telephone Encounter (Signed)
Patient called in wanting to get refill on hydrocodone .

## 2020-04-20 NOTE — Telephone Encounter (Signed)
Request sent to Dr. Nitka  

## 2020-04-22 MED ORDER — OXYCODONE-ACETAMINOPHEN 7.5-325 MG PO TABS: 1.0000 | ORAL_TABLET | Freq: Four times a day (QID) | ORAL | 0 refills | Status: DC | PRN

## 2020-04-30 ENCOUNTER — Other Ambulatory Visit: Payer: Self-pay | Admitting: Specialist

## 2020-04-30 MED ORDER — OXYCODONE-ACETAMINOPHEN 7.5-325 MG PO TABS
1.0000 | ORAL_TABLET | Freq: Four times a day (QID) | ORAL | 0 refills | Status: DC | PRN
Start: 2020-04-30 — End: 2020-05-13

## 2020-04-30 NOTE — Telephone Encounter (Signed)
sent request to Dr. Nitka 

## 2020-04-30 NOTE — Telephone Encounter (Signed)
Patient son Clide Cliff called requesting refill of oxycodone for patient. Please send to pharmacy on file. Patient phone number is [phone].

## 2020-05-13 ENCOUNTER — Other Ambulatory Visit: Payer: Self-pay | Admitting: Specialist

## 2020-05-13 NOTE — Telephone Encounter (Signed)
Patient son called. She need a refill on oxycodone.

## 2020-05-14 MED ORDER — OXYCODONE-ACETAMINOPHEN 7.5-325 MG PO TABS
1.0000 | ORAL_TABLET | Freq: Four times a day (QID) | ORAL | 0 refills | Status: DC | PRN
Start: 2020-05-14 — End: 2020-05-28

## 2020-05-28 ENCOUNTER — Other Ambulatory Visit: Payer: Self-pay

## 2020-05-28 MED ORDER — OXYCODONE-ACETAMINOPHEN 7.5-325 MG PO TABS
1.0000 | ORAL_TABLET | Freq: Four times a day (QID) | ORAL | 0 refills | Status: AC | PRN
Start: 2020-05-28 — End: ?

## 2020-05-28 NOTE — Telephone Encounter (Signed)
Patient called in wanting to get refill of oxycodone

## 2020-06-04 ENCOUNTER — Telehealth: Payer: Self-pay

## 2020-06-04 NOTE — Telephone Encounter (Signed)
Patient called she is requesting prescription refill for oxycodone. Call [phone]

## 2020-06-04 NOTE — Telephone Encounter (Signed)
Please advise 

## 2020-09-14 ENCOUNTER — Telehealth: Payer: Self-pay | Admitting: Specialist

## 2020-09-14 NOTE — Telephone Encounter (Signed)
Received call from [facility] son. Stated pt completed release for records. I advised last request we have is from 02/2020. He states she did one couple months ago.i advised we did not have that one. Advised the one from 02/2020 still valid. We will have another copy ready to be picked up tomorrow, 09/15/2020.

## 2020-09-15 NOTE — Telephone Encounter (Signed)
E 

## 2021-03-08 ENCOUNTER — Telehealth: Payer: Self-pay | Admitting: Specialist

## 2021-03-08 NOTE — Telephone Encounter (Signed)
Pts son Clide Cliff, called stated wasn't able to pickup records as was prepared in January. Another copy prepared ready for pickup.

## 2021-12-26 IMAGING — MR MR LUMBAR SPINE W/O CM
4 of 5 series · 23 of 48 positions shown · non-contrast
Comparison: Thoracic and lumbar spine CT 01/26/2020. Lumbar MRI
01/18/2019.

CLINICAL DATA: 58-year-old female with chronic back pain radiating
down both legs.

EXAM:
MRI LUMBAR SPINE WITHOUT CONTRAST
TECHNIQUE: Multiplanar, multisequence MR imaging of the lumbar spine was
performed. No intravenous contrast was administered.

[Series 3: T2 post-contrast · sagittal · 4.0mm · 0.53mm/px · 6 of 18 slices shown]
[im 1/18]
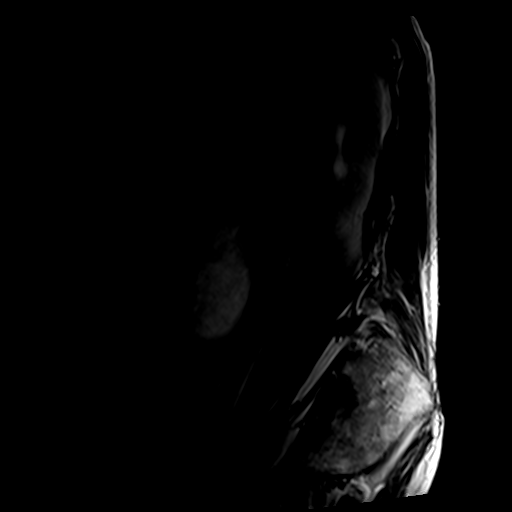
[im 4/18]
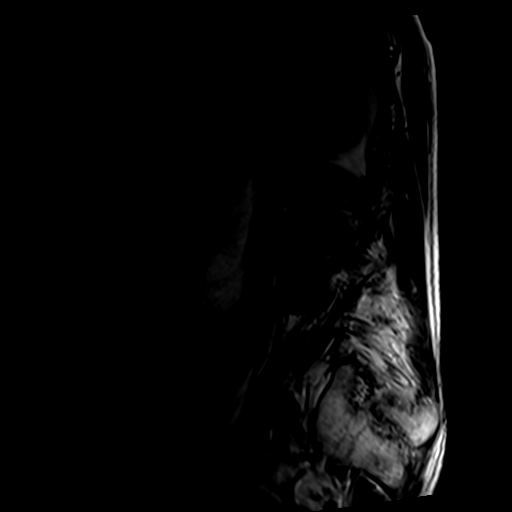
[im 7/18]
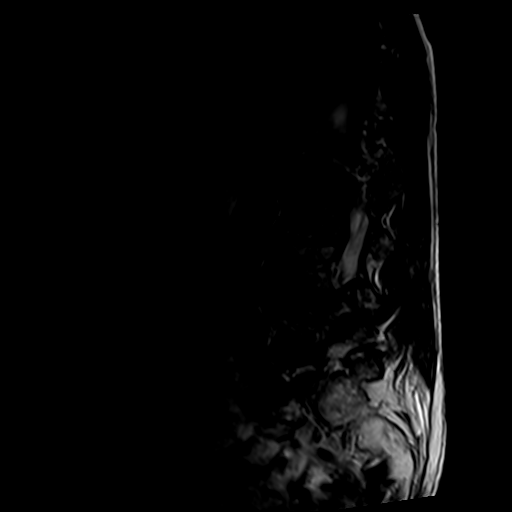
[im 11/18]
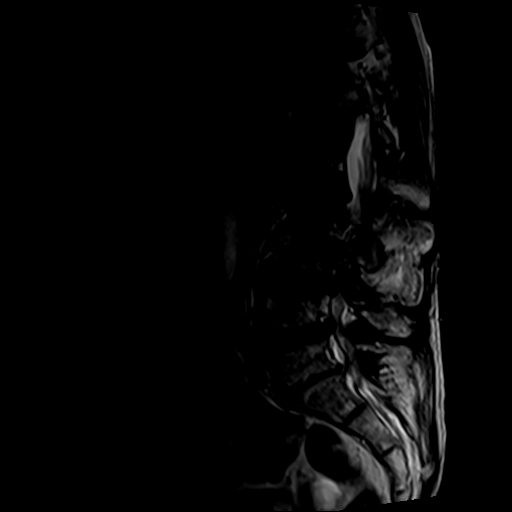
[im 14/18]
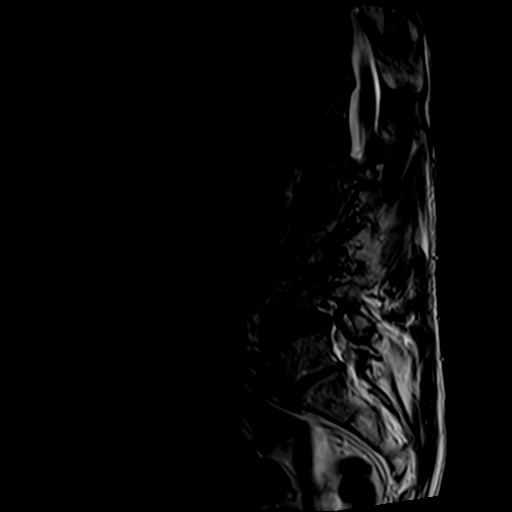
[im 18/18]
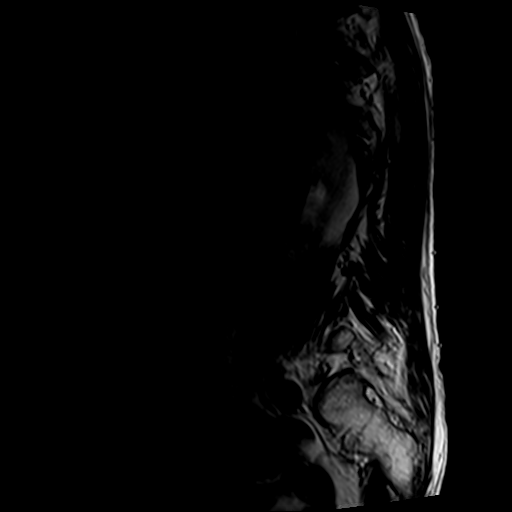

[Series 5: T1 · sagittal · 4.0mm · 0.53mm/px · 6 of 18 slices shown (1 of 2)]
[im 1/18]
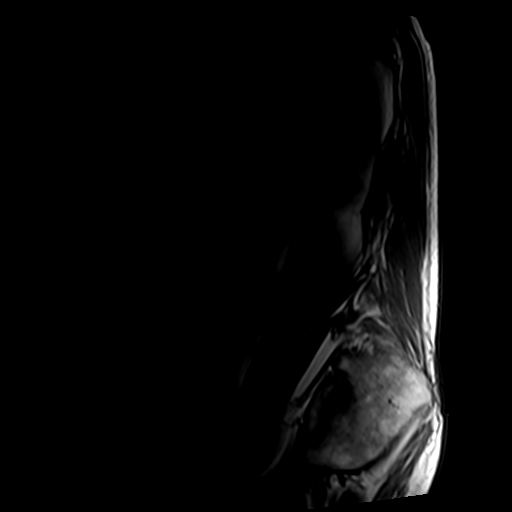
[im 3/18]
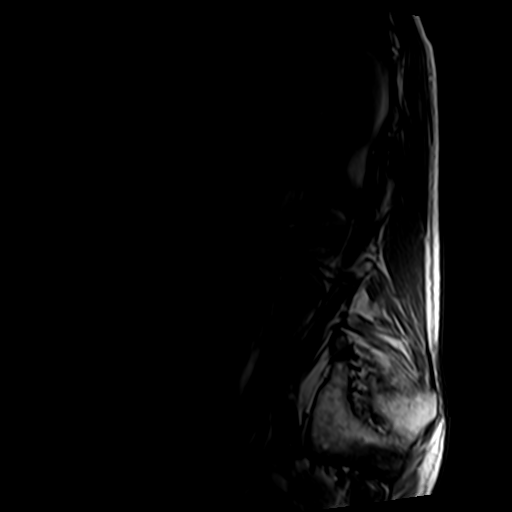
[im 6/18]
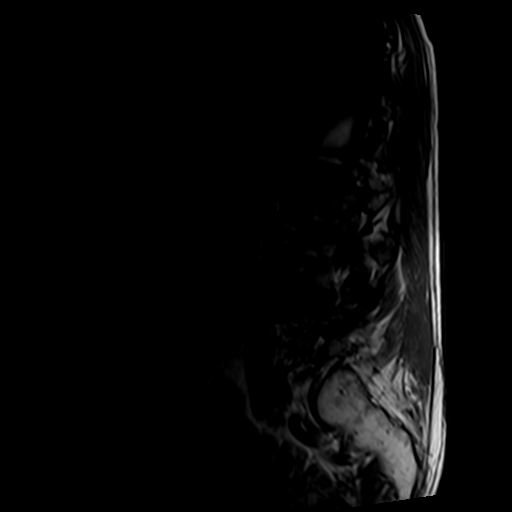
[im 9/18]
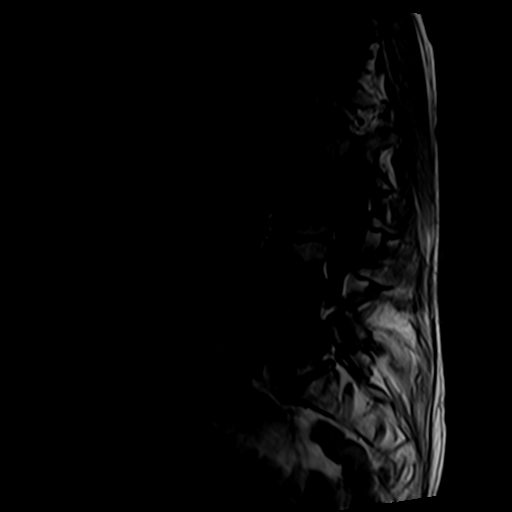
[im 12/18]
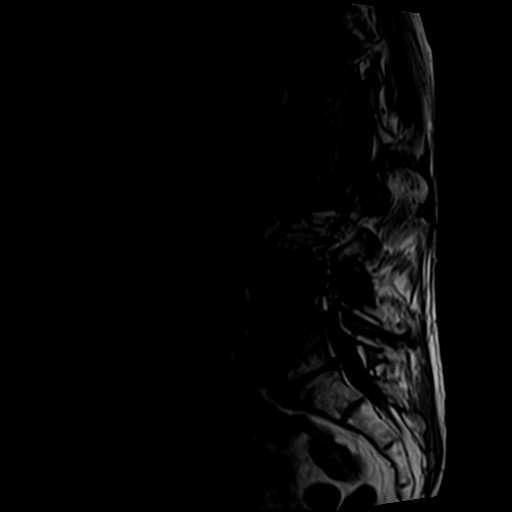
[im 15/18]
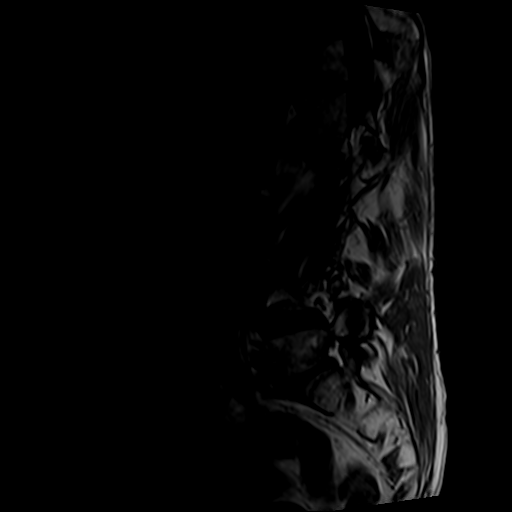

[Series 6: T2 · axial · 4.0mm · 0.70mm/px · z∈[-45,+166]mm · 8 of 38 slices shown]
[im 1/38]
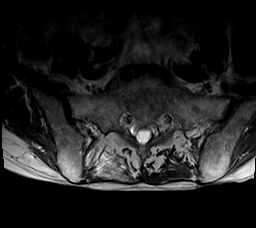
[im 6/38]
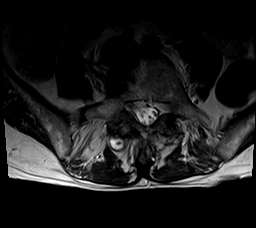
[im 12/38]
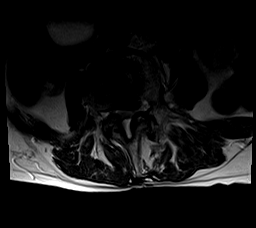
[im 18/38]
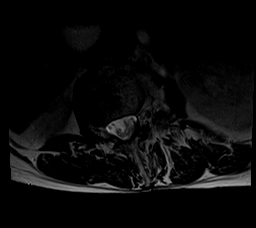
[im 20/38]
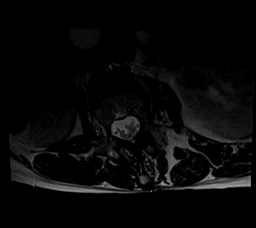
[im 26/38]
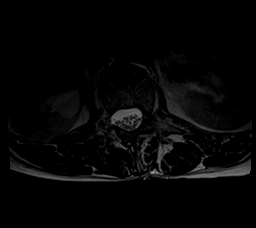
[im 32/38]
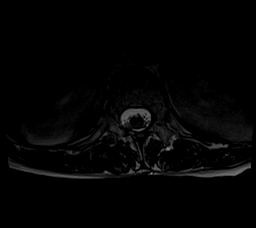
[im 38/38]
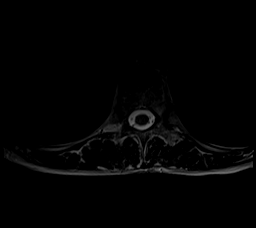

[Series 7: T1 · axial · 4.0mm · 0.35mm/px · z∈[-19,+135]mm · 3 of 38 slices shown (2 of 2)]
[im 6/38]
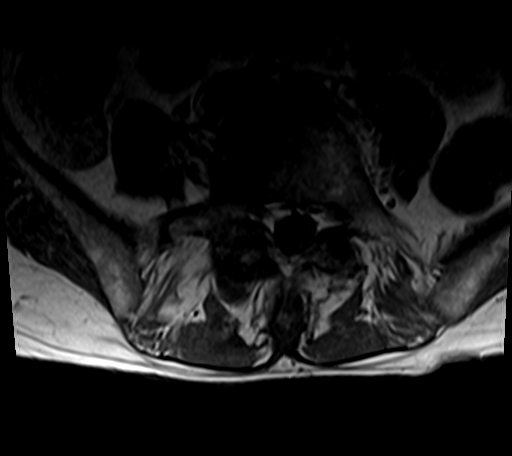
[im 20/38]
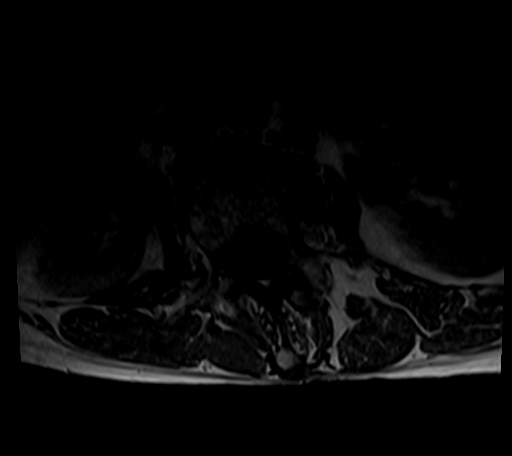
[im 32/38]
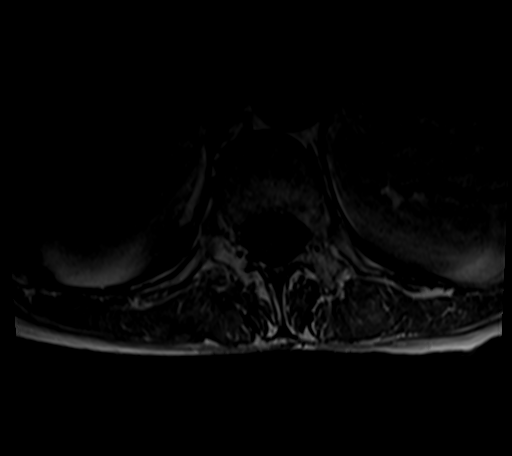

[23 of 48 positions shown; findings below may reference images not displayed]

FINDINGS: Segmentation:  Normal on the comparison CT.

Alignment: Moderate mostly dextroconvex lumbar scoliosis, well
demonstrated on coronal images from the CT in [REDACTED]. Associated
straightening and mild reversal of lumbar lordosis appears stable.

Vertebrae: Chronic interbody ankylosis at L2-L3 better demonstrated
by CT. Confluent but degenerative appearing marrow edema in the L4
vertebral body, adjacent L3 and L5 endplates. Severe intervening
disc space loss at these levels, see below. There is also mild
anterior endplate marrow edema at L5-S1. Background bone marrow
signal is within normal limits. No other marrow edema or acute
osseous abnormality. Intact visible sacrum and SI joints.

Conus medullaris and cauda equina: Conus extends to the T12-L1
level. No lower spinal cord or conus signal abnormality.

Paraspinal and other soft tissues: Stable since 6666, and within
normal limits aside from a chronic area of lumbosacral erector
spinae muscle atrophy with STIR hyperintensity (series 4, image 8).

Disc levels:

No visible lower thoracic spinal stenosis.

T12-L1: Negative disc. Mild to moderate facet hypertrophy greater on
the left.

L1-L2: Disc space loss with mild circumferential disc osteophyte
complex eccentric to the left. Mild to moderate posterior element
hypertrophy greater on the left. Mild left lateral recess stenosis
(left L2 nerve level). This level is stable.

L2-L3: Interbody ankylosis. Bulky left far lateral endplate
spurring. No significant stenosis.

L3-L4: Up to severe left lateral disc space loss with vacuum disc
here in [REDACTED]. Circumferential disc bulge with bulky and broad-based
left subarticular disc (series 6, image 27). Moderate posterior
element hypertrophy. Epidural lipomatosis. Moderate to severe spinal
and severe left lateral recess stenosis (left L4 nerve level).
Severe left L3 foraminal stenosis. This level is mildly progressed
since 6666.

L4-L5: Up to severe right lateral disc space loss with vacuum disc
here in [REDACTED]. Bulky right eccentric circumferential disc osteophyte
complex with moderate to severe posterior element hypertrophy.
Chronic degenerative facet joint fluid. Increased posteriorly
situated synovial cyst on the right since 6666 (series 6, image 33).
Severe spinal stenosis. Moderate to severe left and moderate right
lateral recess stenosis (L5 nerve levels). Moderate left and severe
right L4 foraminal stenosis. Overall this level is stable since
6666.

L5-S1: Severe disc space loss with vacuum disc. Right eccentric
circumferential disc osteophyte complex. Mild to moderate posterior
element hypertrophy. Mild right lateral recess stenosis (right S1
nerve level) without spinal stenosis. Severe right L5 foraminal
stenosis. This level stable.
IMPRESSION: 1. Advanced dextroconvex lumbar scoliosis, with severe disc and
endplate degeneration L3-L4 through L5-S1. Ankylosed L2-L3 level.
Degenerative vertebral body marrow edema L3 through L5.

2. Superimposed moderate to severe posterior element hypertrophy at
L3-L4 and L4-L5 with up to severe subsequent spinal and left lateral
recess stenosis. Severe left L3 and right L4 neural foraminal
stenosis. The L3-L4 level has mildly progressed since the 6666 MRI.

3. Stable L5-S1 degeneration with mild right lateral recess and
severe right foraminal stenosis.
